# Patient Record
Sex: Male | Born: 1960
Health system: Southern US, Community
[De-identification: ages and names within clinical notes are randomized; demographics above are authoritative.]

## PROBLEM LIST (undated history)

## (undated) DIAGNOSIS — I10 Essential (primary) hypertension: Secondary | ICD-10-CM

## (undated) DIAGNOSIS — A159 Respiratory tuberculosis unspecified: Secondary | ICD-10-CM

## (undated) DIAGNOSIS — F1910 Other psychoactive substance abuse, uncomplicated: Secondary | ICD-10-CM

## (undated) DIAGNOSIS — J449 Chronic obstructive pulmonary disease, unspecified: Secondary | ICD-10-CM

## (undated) DIAGNOSIS — F419 Anxiety disorder, unspecified: Secondary | ICD-10-CM

## (undated) DIAGNOSIS — F1010 Alcohol abuse, uncomplicated: Secondary | ICD-10-CM

## (undated) DIAGNOSIS — J189 Pneumonia, unspecified organism: Secondary | ICD-10-CM

## (undated) HISTORY — PX: ANTERIOR CERVICAL DECOMP/DISCECTOMY FUSION: SHX1161

## (undated) HISTORY — PX: BACK SURGERY: SHX140

## (undated) HISTORY — PX: SHOULDER ARTHROSCOPY WITH ROTATOR CUFF REPAIR: SHX5685

## (undated) HISTORY — PX: ORBITAL FRACTURE SURGERY: SHX725

## (undated) HISTORY — DX: Essential (primary) hypertension: I10

## (undated) HISTORY — PX: INGUINAL HERNIA REPAIR: SUR1180

## (undated) HISTORY — PX: APPENDECTOMY: SHX54

## (undated) HISTORY — PX: ULNAR NERVE REPAIR: SHX2594

---

## 1998-11-15 ENCOUNTER — Ambulatory Visit (HOSPITAL_COMMUNITY): Admission: RE | Admit: 1998-11-15 | Discharge: 1998-11-15 | Payer: Self-pay | Admitting: Orthopedic Surgery

## 1998-11-15 ENCOUNTER — Encounter: Payer: Self-pay | Admitting: Orthopedic Surgery

## 2001-01-24 ENCOUNTER — Emergency Department (HOSPITAL_COMMUNITY): Admission: EM | Admit: 2001-01-24 | Discharge: 2001-01-24 | Payer: Self-pay | Admitting: Emergency Medicine

## 2004-05-27 ENCOUNTER — Encounter: Admission: RE | Admit: 2004-05-27 | Discharge: 2004-06-06 | Payer: Self-pay | Admitting: Nurse Practitioner

## 2005-11-20 ENCOUNTER — Emergency Department (HOSPITAL_COMMUNITY): Admission: EM | Admit: 2005-11-20 | Discharge: 2005-11-20 | Payer: Self-pay | Admitting: Emergency Medicine

## 2005-11-21 ENCOUNTER — Inpatient Hospital Stay (HOSPITAL_COMMUNITY): Admission: EM | Admit: 2005-11-21 | Discharge: 2005-11-26 | Payer: Self-pay | Admitting: Emergency Medicine

## 2010-05-21 ENCOUNTER — Emergency Department (HOSPITAL_COMMUNITY): Admission: EM | Admit: 2010-05-21 | Discharge: 2010-05-21 | Payer: Self-pay | Admitting: Emergency Medicine

## 2010-06-20 ENCOUNTER — Emergency Department (HOSPITAL_COMMUNITY): Admission: EM | Admit: 2010-06-20 | Discharge: 2010-06-20 | Payer: Self-pay | Admitting: Emergency Medicine

## 2010-08-19 ENCOUNTER — Emergency Department (HOSPITAL_COMMUNITY): Admission: EM | Admit: 2010-08-19 | Discharge: 2010-08-19 | Payer: Self-pay | Admitting: Emergency Medicine

## 2011-02-19 ENCOUNTER — Emergency Department (HOSPITAL_COMMUNITY)
Admission: EM | Admit: 2011-02-19 | Discharge: 2011-02-19 | Disposition: A | Discharge status: Home / Self Care | Payer: Self-pay | Attending: Emergency Medicine | Admitting: Emergency Medicine

## 2011-02-19 DIAGNOSIS — X58XXXA Exposure to other specified factors, initial encounter: Secondary | ICD-10-CM | POA: Insufficient documentation

## 2011-02-19 DIAGNOSIS — S335XXA Sprain of ligaments of lumbar spine, initial encounter: Secondary | ICD-10-CM | POA: Insufficient documentation

## 2011-02-19 DIAGNOSIS — M5430 Sciatica, unspecified side: Secondary | ICD-10-CM | POA: Insufficient documentation

## 2011-04-15 ENCOUNTER — Emergency Department (HOSPITAL_COMMUNITY): Payer: Worker's Compensation

## 2011-04-15 ENCOUNTER — Emergency Department (HOSPITAL_COMMUNITY)
Admission: EM | Admit: 2011-04-15 | Discharge: 2011-04-16 | Disposition: A | Discharge status: Home / Self Care | Payer: Worker's Compensation | Attending: Emergency Medicine | Admitting: Emergency Medicine

## 2011-04-15 DIAGNOSIS — Z23 Encounter for immunization: Secondary | ICD-10-CM | POA: Insufficient documentation

## 2011-04-15 DIAGNOSIS — M79609 Pain in unspecified limb: Secondary | ICD-10-CM | POA: Insufficient documentation

## 2011-04-15 DIAGNOSIS — L03019 Cellulitis of unspecified finger: Secondary | ICD-10-CM | POA: Insufficient documentation

## 2011-04-15 DIAGNOSIS — L02519 Cutaneous abscess of unspecified hand: Secondary | ICD-10-CM | POA: Insufficient documentation

## 2011-04-16 LAB — BASIC METABOLIC PANEL
BUN: 11 mg/dL (ref 6–23)
Chloride: 100 mEq/L (ref 96–112)
Glucose, Bld: 99 mg/dL (ref 70–99)
Potassium: 3.8 mEq/L (ref 3.5–5.1)

## 2011-04-16 LAB — GRAM STAIN

## 2011-04-16 LAB — DIFFERENTIAL
Basophils Absolute: 0.1 10*3/uL (ref 0.0–0.1)
Basophils Relative: 1 % (ref 0–1)
Eosinophils Absolute: 0.4 10*3/uL (ref 0.0–0.7)
Monocytes Absolute: 1.1 10*3/uL — ABNORMAL HIGH (ref 0.1–1.0)
Neutro Abs: 9 10*3/uL — ABNORMAL HIGH (ref 1.7–7.7)

## 2011-04-16 LAB — CBC
Hemoglobin: 15.1 g/dL (ref 13.0–17.0)
MCHC: 34.1 g/dL (ref 30.0–36.0)
RDW: 13.5 % (ref 11.5–15.5)

## 2011-04-17 NOTE — Discharge Summary (Signed)
Alejandro Ferguson, [name]                  ACCOUNT NO.:  [id number]   MEDICAL RECORD NO.:  [id number]          PATIENT TYPE:  INP   LOCATION:  1612                         FACILITY:  [facility]   PHYSICIAN:  Isidor Holts, M.D.  DATE OF BIRTH:  1961/10/06   DATE OF ADMISSION:  11/21/2005  DATE OF DISCHARGE:  11/25/2005                                 DISCHARGE SUMMARY   DISCHARGE DIAGNOSES:  1.  Bilateral community acquired pneumonia.  2.  Acute maxillary sinusitis.  3.  Chronic obstructive pulmonary disease.  4.  Substance abuse.   DISCHARGE MEDICATIONS:  1.  Avelox 400 mg p.o. daily x5 days from November 21, 2005.  2.  Motrin 800 mg p.o. t.i.d. with meals for 1 week.  3.  Ultram 50 mg p.o. p.r.n. q.6 hourly for headache.  A total of 12 pills      have been dispensed.   PROCEDURE:  1.  Chest x-ray dated November 20, 2005. This showed changes of COPD and      bronchitis with pneumonia in the medial right lower lobe.  2.  Chest x-ray dated November 21, 2005. This showed emphysema and left      lower lobe pneumonia.  3.  Head CT scan dated November 21, 2005. This showed no intracranial      abnormality, maxillary sinus inflammatory changes or physical changes,      anterior wall of frontal sinuses on the right, no sinus disease in that      region presently.   CONSULTATIONS:  None.   HISTORY:  As in H&P note of November 21, 2005. However, in brief, this is a  50 year old male, with a background history of multiple surgeries, including  disk operation on his neck, shoulder surgery and a plate in his skull, as  well as appendectomy, who presents with a week`s history of cough, fever,  and production of yellow phlegm. He was seen on November 20, 2005, in the  emergency department, started on antibiotics and analgesics but due to  persistence of symptoms including hemoptysis and headache. He re-presented  on November 21, 2005 and was admitted for further evaluation, investigation  and  management.   CLINICAL COURSE:  1.  Community acquired pneumonia.  This was managed with a combination of      azithromycin and Rocephin. The patient completed a 5 day course of this      treatment on November 25, 2005 and showed steady improvement clinically.      The cough became markedly diminished in frequency and phlegm became      clear. He was apyrexial throughout the course of his hospital stay and      showed no evidence of shortness of breath. On physical exam on November 25, 2005, lung fields were clear. It appears that his pneumonia has      either markedly improved or resolved clinically. He was considered      sufficiently recovered for discharge home on November 25, 2005 on a 4-5      day course of Avelox.  1.  Chronic obstructive pulmonary disease. This was evident on chest x-ray      dated November 21, 2005, however, the patient showed no shortness of      breath or wheeze referable to this. It is deemed that this has remained      stable.   1.  Acute maxillary sinusitis. An incidental finding on head CT scan, dated      November 21, 2005. The patient, however, does not complain of nasal      congestion or stuffiness.  aA a matter of fact, initial nasal      decongestant which was started at the time of admission, was      discontinued on November 24, 2005, without any relapse of symptoms. He      does have a complaint of a persistent headache, which is likely      secondary to his sinusitis. He is being managed with NSAIDs for this,      and will be discharged home on additional p.r.n. Ultram for      breakthrough. It is expected that this will resolve as sinus infection      and pneumonia resolve clinically. This has been communicated to the      patient accordingly.   1.  History of substance abuse. The patient has been counseled accordingly.      He, however, insists, that he does not abuse substances and that he does      not require counseling. I do not  feel that he will attend ADS as      recommended.   DISPOSITION:  The patient was discharged in satisfactory condition on  November 25, 2005. He is expected to return to work on December 01, 2005.   DIET:  No restrictions.   ACTIVITY:  As tolerated.   WOUND CARE:  N/A.   PAIN MANAGEMENT:  N/A.   FOLLOW UP:  The patient is to followup with his PMD, he is to call for an  appointment. If he does not have a PMD, he has been recommended to find one  and establish care, for routine preventative purposes. All of this has been  communicated to the patient, he has verbalized understanding.      Isidor Holts, M.D.  Electronically Signed     CO/MEDQ  D:  11/25/2005  T:  11/25/2005  Job:  [id number]

## 2011-04-17 NOTE — H&P (Signed)
NAMECRISS, PALLONE                  ACCOUNT NO.:  [id number]   MEDICAL RECORD NO.:  [id number]          PATIENT TYPE:  INP   LOCATION:  1612                         FACILITY:  [facility]   PHYSICIAN:  Jonna L. Robb Matar, M.D.DATE OF BIRTH:  May 10, 1961   DATE OF ADMISSION:  11/21/2005  DATE OF DISCHARGE:                                HISTORY & PHYSICAL   CHIEF COMPLAINT:  Coughing up blood.   HISTORY:  This 50 year old white male has had a week's history of cough,  fever, production of yellow mucus. Yesterday he was in the emergency room,  was diagnosed with pneumonia, but did not want to come in. He received one  dose each of Rocephin and Zithromax and was sent home on Levaquin, but today  he started to have some hemoptysis and was still having a lot of headache  pain, so he was willing to come in and be admitted today.   PAST MEDICAL HISTORY:  Mostly notable for multiple surgeries including disc  operation on his neck, shoulder surgery, he has a plate in his head, an  appendectomy.   FAMILY HISTORY:  Hypertension, lung cancer.   SOCIAL HISTORY:  He smokes. Says he only does drugs occasionally. Separated,  three children.   ALLERGIES:  He said when he was 50 years old that PENICILLIN gave him  problems with his throat.   MEDICINES:  He was sent home on Levaquin and Vicodin.   REVIEW OF SYSTEMS:  Other than the immediate illness, all other systems were  reviewed and were negative.   PHYSICAL EXAMINATION:  VITAL SIGNS:  Temperature 97.3, pulse 93,  respirations 22, blood pressure 125/85.  GENERAL:  He is an anxious, jittery Caucasian male sitting up in bed,  occasional coughing.  HEENT:  Normal hearing, erythematous pharynx.  NECK:  Shows no masses, thyromegaly, or carotid bruits.  RESPIRATORY:  Effort is normal. The lungs have some scattered rhonchi,  especially at the bases, without wheezing or rales. There is no dullness.  HEART:  Has a regular rate and rhythm, normal S1 and  S2, without murmurs,  rubs, or gallops. Pulses without bruits. There is no clubbing, cyanosis, or  edema.  ABDOMEN:  Nontender, normal bowel sounds. No hepatosplenomegaly or hernia.  GENITOURINARY:  External genitalia is normal.  MUSCULOSKELETAL:  There is no cervical adenopathy. Muscle strength is 5/5  with full range of motion all four extremities.  SKIN:  Shows no rashes, lesions, nodules.  NEUROLOGIC:  Cranial nerves are intact. There is some tenderness over the  sinuses. DTRs are 2+. Toes go down. The patient is alert and oriented x3,  normal memory, anxious affect. He seems to be excessively concerned about  getting pain medicine for his headache. Also has not eaten, so he is rather  jittery.   LABORATORY WORK:  CT of the head shows a normal brain but does have  maxillary sinusitis. Urinalysis negative. Drugs of abuse were positive for  opiates, cocaine, and tetrahydrocannabinol. Chest x-ray on December 22 shows  COPD, bronchitis, with some right lower lobe pneumonia. White count was  18.1.  Today the white count is down to 11.1. Chest x-ray shows left lower  lobe infiltrate is worse. BUN and creatinine are 10 and 0.7, glucose was 63.   IMPRESSION:  1.  Right lower lobe pneumonia. Will put him back on Rocephin and Zithromax.  2.  Maxillary sinusitis. I am going to put him on some decongestants,      antihistamines, nasal spray, try to open up the sinuses.  3.  Polysubstance abuse. I would really prefer not to give this gentleman      large amounts of narcotics so will try p.o. Percocet if IV Toradol does      not work. I am also going to give him some Xanax to calm down as he is      very jittery and I wonder if this is from the cocaine withdrawal.      Jonna L. Robb Matar, M.D.  Electronically Signed     JLB/MEDQ  D:  11/21/2005  T:  11/22/2005  Job:  [id number]

## 2011-04-18 LAB — WOUND CULTURE

## 2011-04-24 ENCOUNTER — Emergency Department (HOSPITAL_COMMUNITY)
Admission: EM | Admit: 2011-04-24 | Discharge: 2011-04-24 | Disposition: A | Discharge status: Home / Self Care | Payer: Worker's Compensation | Attending: Emergency Medicine | Admitting: Emergency Medicine

## 2011-04-24 DIAGNOSIS — L03019 Cellulitis of unspecified finger: Secondary | ICD-10-CM | POA: Insufficient documentation

## 2011-04-24 DIAGNOSIS — M7989 Other specified soft tissue disorders: Secondary | ICD-10-CM | POA: Insufficient documentation

## 2011-04-24 DIAGNOSIS — Z9889 Other specified postprocedural states: Secondary | ICD-10-CM | POA: Insufficient documentation

## 2011-04-24 DIAGNOSIS — L02519 Cutaneous abscess of unspecified hand: Secondary | ICD-10-CM | POA: Insufficient documentation

## 2011-05-12 ENCOUNTER — Ambulatory Visit (HOSPITAL_COMMUNITY)
Admission: RE | Admit: 2011-05-12 | Discharge: 2011-05-12 | Disposition: A | Discharge status: Home / Self Care | Payer: Worker's Compensation | Source: Ambulatory Visit | Attending: Plastic Surgery | Admitting: Plastic Surgery

## 2011-05-12 ENCOUNTER — Other Ambulatory Visit (HOSPITAL_COMMUNITY): Payer: Self-pay | Admitting: Plastic Surgery

## 2011-05-12 DIAGNOSIS — R609 Edema, unspecified: Secondary | ICD-10-CM | POA: Insufficient documentation

## 2011-05-12 DIAGNOSIS — M79609 Pain in unspecified limb: Secondary | ICD-10-CM | POA: Insufficient documentation

## 2011-05-12 DIAGNOSIS — R52 Pain, unspecified: Secondary | ICD-10-CM

## 2011-06-01 ENCOUNTER — Encounter: Payer: Self-pay | Admitting: Occupational Therapy

## 2011-07-21 ENCOUNTER — Emergency Department (HOSPITAL_COMMUNITY): Payer: Self-pay

## 2011-07-21 ENCOUNTER — Encounter (HOSPITAL_COMMUNITY): Payer: Self-pay | Admitting: Radiology

## 2011-07-21 ENCOUNTER — Inpatient Hospital Stay (HOSPITAL_COMMUNITY)
Admission: EM | Admit: 2011-07-21 | Discharge: 2011-07-23 | DRG: 192 | Disposition: A | Discharge status: Home / Self Care | Payer: Self-pay | Attending: Internal Medicine | Admitting: Internal Medicine

## 2011-07-21 DIAGNOSIS — Y92009 Unspecified place in unspecified non-institutional (private) residence as the place of occurrence of the external cause: Secondary | ICD-10-CM

## 2011-07-21 DIAGNOSIS — Z7982 Long term (current) use of aspirin: Secondary | ICD-10-CM

## 2011-07-21 DIAGNOSIS — F1020 Alcohol dependence, uncomplicated: Secondary | ICD-10-CM | POA: Diagnosis present

## 2011-07-21 DIAGNOSIS — Z79899 Other long term (current) drug therapy: Secondary | ICD-10-CM

## 2011-07-21 DIAGNOSIS — J441 Chronic obstructive pulmonary disease with (acute) exacerbation: Principal | ICD-10-CM | POA: Diagnosis present

## 2011-07-21 DIAGNOSIS — D72829 Elevated white blood cell count, unspecified: Secondary | ICD-10-CM | POA: Diagnosis present

## 2011-07-21 DIAGNOSIS — T486X5A Adverse effect of antiasthmatics, initial encounter: Secondary | ICD-10-CM | POA: Diagnosis present

## 2011-07-21 DIAGNOSIS — R071 Chest pain on breathing: Secondary | ICD-10-CM | POA: Diagnosis present

## 2011-07-21 DIAGNOSIS — Z801 Family history of malignant neoplasm of trachea, bronchus and lung: Secondary | ICD-10-CM

## 2011-07-21 DIAGNOSIS — R000 Tachycardia, unspecified: Secondary | ICD-10-CM | POA: Diagnosis present

## 2011-07-21 DIAGNOSIS — IMO0002 Reserved for concepts with insufficient information to code with codable children: Secondary | ICD-10-CM

## 2011-07-21 DIAGNOSIS — R222 Localized swelling, mass and lump, trunk: Secondary | ICD-10-CM | POA: Diagnosis present

## 2011-07-21 DIAGNOSIS — Z880 Allergy status to penicillin: Secondary | ICD-10-CM

## 2011-07-21 DIAGNOSIS — Z8249 Family history of ischemic heart disease and other diseases of the circulatory system: Secondary | ICD-10-CM

## 2011-07-21 DIAGNOSIS — Z87891 Personal history of nicotine dependence: Secondary | ICD-10-CM

## 2011-07-21 HISTORY — DX: Alcohol abuse, uncomplicated: F10.10

## 2011-07-21 LAB — BASIC METABOLIC PANEL
BUN: 7 mg/dL (ref 6–23)
Chloride: 97 mEq/L (ref 96–112)
Creatinine, Ser: 0.96 mg/dL (ref 0.50–1.35)
GFR calc Af Amer: >60 mL/min (ref >60)
GFR calc non Af Amer: >60 mL/min (ref >60)

## 2011-07-21 LAB — CBC
HCT: 47.9 % (ref 39.0–52.0)
MCH: 32.8 pg (ref 26.0–34.0)
MCHC: 35.7 g/dL (ref 30.0–36.0)
MCV: 91.8 fL (ref 78.0–100.0)
RDW: 13.1 % (ref 11.5–15.5)

## 2011-07-21 LAB — PHOSPHORUS: Phosphorus: 2.2 mg/dL — ABNORMAL LOW (ref 2.3–4.6)

## 2011-07-21 LAB — TROPONIN I: Troponin I: <0.3 ng/mL (ref <0.30)

## 2011-07-21 LAB — APTT: aPTT: 32 seconds (ref 24–37)

## 2011-07-21 LAB — CK TOTAL AND CKMB (NOT AT ARMC): Relative Index: INVALID (ref 0.0–2.5)

## 2011-07-21 MED ORDER — IOHEXOL 300 MG/ML  SOLN
100.0000 mL | Freq: Once | INTRAMUSCULAR | Status: AC | PRN
  Administered 2011-07-21: 100 mL via INTRAVENOUS

## 2011-07-22 ENCOUNTER — Telehealth: Payer: Self-pay | Admitting: Pulmonary Disease

## 2011-07-22 DIAGNOSIS — R222 Localized swelling, mass and lump, trunk: Secondary | ICD-10-CM

## 2011-07-22 DIAGNOSIS — J690 Pneumonitis due to inhalation of food and vomit: Secondary | ICD-10-CM

## 2011-07-22 DIAGNOSIS — J13 Pneumonia due to Streptococcus pneumoniae: Secondary | ICD-10-CM

## 2011-07-22 DIAGNOSIS — R911 Solitary pulmonary nodule: Secondary | ICD-10-CM

## 2011-07-22 LAB — CBC
HCT: 46.4 % (ref 39.0–52.0)
MCH: 33.2 pg (ref 26.0–34.0)
MCV: 92.8 fL (ref 78.0–100.0)
Platelets: 193 10*3/uL (ref 150–400)
RBC: 5 MIL/uL (ref 4.22–5.81)

## 2011-07-22 LAB — URINALYSIS, ROUTINE W REFLEX MICROSCOPIC
Bilirubin Urine: NEGATIVE
Glucose, UA: 100 mg/dL — AB
Hgb urine dipstick: NEGATIVE
Ketones, ur: NEGATIVE mg/dL
Protein, ur: NEGATIVE mg/dL
Urobilinogen, UA: 0.2 mg/dL (ref 0.0–1.0)

## 2011-07-22 LAB — AMYLASE: Amylase: 78 U/L (ref 0–105)

## 2011-07-22 LAB — COMPREHENSIVE METABOLIC PANEL
ALT: 19 U/L (ref 0–53)
AST: 24 U/L (ref 0–37)
Albumin: 3.4 g/dL — ABNORMAL LOW (ref 3.5–5.2)
Alkaline Phosphatase: 86 U/L (ref 39–117)
CO2: 25 mEq/L (ref 19–32)
Chloride: 101 mEq/L (ref 96–112)
Creatinine, Ser: 0.86 mg/dL (ref 0.50–1.35)
GFR calc non Af Amer: >60 mL/min (ref >60)
Potassium: 4.6 mEq/L (ref 3.5–5.1)
Total Bilirubin: 0.9 mg/dL (ref 0.3–1.2)

## 2011-07-22 LAB — HEPATIC FUNCTION PANEL
ALT: 17 U/L (ref 0–53)
AST: 21 U/L (ref 0–37)
Bilirubin, Direct: 0.2 mg/dL (ref 0.0–0.3)

## 2011-07-22 LAB — TROPONIN I
Troponin I: <0.3 ng/mL (ref <0.30)
Troponin I: <0.3 ng/mL (ref <0.30)

## 2011-07-22 LAB — CK TOTAL AND CKMB (NOT AT ARMC): Relative Index: INVALID (ref 0.0–2.5)

## 2011-07-22 NOTE — Telephone Encounter (Signed)
Pt has post hosp f/u appt with RA on 9/24.  Brandi O states pt needs f/u CT in 3 weeks.  RA, please advise if CT is w/ or w/o contrast and a dx and I can go ahead and put in order for you.  Thanks.

## 2011-07-22 NOTE — Telephone Encounter (Signed)
Ct with contrast - FU RLL nodule - order based on consult by dr Tyson Alias.

## 2011-07-23 NOTE — Discharge Summary (Signed)
NAMECAHLIL, SATTAR NO.:  [id number]  MEDICAL RECORD NO.:  [id number]  LOCATION:  3741                         FACILITY:  [facility]  PHYSICIAN:  Andreas Blower, MD       DATE OF BIRTH:  01/13/1961  DATE OF ADMISSION:  07/21/2011 DATE OF DISCHARGE:  07/23/2011                              DISCHARGE SUMMARY   PRIMARY CARE PHYSICIAN:  The patient is in the process of being scheduled to see [facility].  Pulmonologist is going to be Oretha Milch, MD.  DISCHARGE DIAGNOSES: 1. Chronic obstructive pulmonary disease exacerbation. 2. 3-cm anteromedial right upper lobe mass of unclear etiology. 3. Alcohol dependence. 4. Chest pain likely chest wall pain from cough. 5. Tachycardia. 6. Leukocytosis.  DISCHARGE MEDICATIONS: 1. Advair Diskus 250/50 one puff inhaled twice daily to start after     completing prednisone taper. 2. Benzonatate 100 mg p.o. 3 times a day as needed for cough. 3. Combivent 1 spray every 6 hours as needed for shortness of breath. 4. Folic acid 1 mg p.o. daily. 5. Guaifenesin 600 mg p.o. twice daily. 6. Guaifenesin/DM 100-10 mg/5 mL syrup 5 mL every 4 hours as needed     for cough. 7. Hydrocodone/APAP 5/325 one tablet every 6 hours as needed for     cough, given total of 20 tablets. 8. Moxifloxacin 400 mg p.o. daily for 19 more days to complete a 3-     week course of antibiotics. 9. Prednisone 40 mg p.o. daily for 2 days, then 20 mg p.o. daily for 2     days, then 10 mg p.o. daily for 2 days, then 5 mg p.o. daily for 2     days, and then discontinue. 10.Thiamine 100 mg p.o. daily. 11.Aspirin 81 mg p.o. daily.  BRIEF ADMITTING HISTORY AND PHYSICAL:  Mr. Hughlett is a 50 year old Caucasian male with history of tobacco use, who quit smoking about a month ago, comes in with complaints of hemoptysis, chest pain, and shortness of breath.  RADIOLOGY/IMAGING: 1. The patient had chest x-ray two-view which showed bullous emphysema     without acute  disease. 2. The patient had a CT of the chest with contrast, which was negative     for acute PE.  Showed 3-cm anteromedial right upper lobe mass-like     opacity with adjacent right hilar adenopathy.  A 5-mm subpleural     lingula nodule noted.  LABORATORY DATA:  CBC shows a white count of 11.1, hemoglobin 16.6, hematocrit 46.4, platelet count 193.  Electrolytes normal with a BUN 8, creatinine 0.86.  Liver function tests normal with albumin of 3.4. Troponins negative x3.  TSH is 0.37.  Blood alcohol level was less than 11.  UA is negative for nitrites and leukocytes.  Blood cultures x2 showed no growth to date.  CONSULTATIONS:  Dr. Tyson Alias from Pulmonary evaluated the patient during the course of the hospital stay.  HOSPITAL COURSE BY PROBLEM: 1. Chronic obstructive pulmonary disease exacerbation.  Initially, the     patient was started on IV steroids and IV moxifloxacin.  During the     course of the hospital stay as his breathing was improved,  his     steroids were transitioned to oral taper.  The patient will be on     moxifloxacin for 19 more days to complete at least 3-week course of     antibiotics.  After the patient completes prednisone taper, the     patient will be on Advair for maintenance. 2. 3-cm anteromedial right upper lobe mass.  Pulmonology was     consulted.  They recommended treating the patient for a total of 3     weeks with antibiotics then to follow up in their clinic for     further evaluation.  If this mass is infectious in etiology should     improve as he is treated with antibiotics.  If the patient has     persistent mass, it will need further workup during follow up with     Pulmonology. 3. Alcohol dependence.  The patient was started on CIWA protocol.  On     extensive discussion with the patient, he does not believe that he     has problem with alcohol and also was not interested in being     offered resources for alcohol cessation.  He will be  discharged     home on thiamine and folate. 4. Chest pain.  The patient was admitted and was ruled out for acute     coronary syndrome.  No events were noted on tele.  The patient had     pain to palpation on the chest.  Suspect the chest wall pain is     likely musculoskeletal from cough. 5. Cough with some hemoptysis likely due to COPD exacerbation with the     infectious etiology.  Improved during the course of the hospital     stay. 6. Tobacco use.  The patient reports that he has not smoked for about     a month and promises to stay away from smoking. 7. Tachycardia. Likely from albuterol and cough. Improved during     the course of the hospital stay.  DISPOSITION AND FOLLOWUP:  The patient to follow up with Dr. Vassie Loll on August 24, 2011 at 11:15 a.m.  The patient was instructed to be in office at 11 a.m.  The patient to follow with [facility], Dr. Clelia Croft on September 01, 2011 at 2:45 p.m.  The patient was also instructed that Dr. Reginia Naas office will call the patient for CT of the chest.  Time spent on discharge talking to the patient, going over the instructions, and coordinating care was 35 minutes.     Andreas Blower, MDSR/MEDQ  D:  07/23/2011  T:  07/23/2011  Job:  [id number]  Electronically Signed by Wardell Heath Deshon Koslowski  on 07/23/2011 09:38:55 PM

## 2011-07-24 NOTE — Consult Note (Addendum)
NAMEMARTI, Alejandro Ferguson NO.:  [id number]  MEDICAL RECORD NO.:  [id number]  LOCATION:  3741                         FACILITY:  [facility]  PHYSICIAN:  Nelda Bucks, MD DATE OF BIRTH:  Jun 11, 1961  DATE OF CONSULTATION:  07/22/2011 DATE OF DISCHARGE:                                CONSULTATION   REQUESTING PHYSICIAN:  Dr. Betti Cruz from [facility].  ALLERGIES:  Alejandro Ferguson has an allergy to PENICILLIN.  He indicates that this is a childhood allergy and he is unaware of specific response.  CHIEF COMPLAINT:  Abnormal CT scan of the chest.  HISTORY OF PRESENT ILLNESS:  Alejandro Ferguson is a 50 year old white male former tobacco abuser with a past medical history of cervical disk surgery and facial trauma after a fall from scaffolding, left shoulder surgery who was admitted to [facility] per [facility] on July 21, 2011, with a 2-day history of chest pain, productive cough with flecks of blood and fevers.  He also had episodes of nausea and vomiting on Monday, July 20, 2011, and had hematemesis and vomiting.  Post vomiting episodes, his bloody sputum also resolved.  He presented to the ER with fevers of 102.8 and in the setting of chest pain, shortness of breath, a CTA was obtained which was negative for PE, but did demonstrate severe upper lobe predominant bullous emphysema and right upper lobe mass-like opacity.  Pulmonary and Critical Care Medicine were consulted for evaluation of right upper lobe mass/opacity.  PAST MEDICAL HISTORY: 1. Cervical disk surgery. 2. Left shoulder surgery. 3. Appendectomy. 4. Tobacco abuse. 5. Emphysema diagnosed on CT scan July 21, 2011.  SOCIAL HISTORY:  Alejandro Ferguson is divorced and has 2 children a daughter and stepdaughter.  He lives in [place] alone and he works as an Engineer, petroleum.  He is a former smoker.  He quit in July 2012.  He indicates that he quit due to cough.  He began smoking at age 31 and smoked 1  pack per day initially.  However, at his heaviest and at time of quitting, he smoked 2 packs per day for a total of 32 years.  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  He indicates that he did wear a mask.  However, he wonders how effective they were.  He drinks four 12 ounces of beer per day on a usual day.  However, on the weekends, he does drink a 12-pack per day on the weekend, but he also indicates that he does not have to drink every day and does not ever have withdrawal symptoms.  He indicates occasional marijuana use and regular diet.  FAMILY HISTORY:  His mother is still living in age 64.  She has a history of hypertension.  His father he has never known and does not have a relationship with.  His grandfather has a history of lung cancer and is deceased at age 110.  He has 2 brothers who are relatively healthy.  One currently is at [facility] for a hand surgery.  MEDICATIONS:  He has no home medications.  However, currently he is on Lovenox,  Ventolin and Atrovent q.6, guaifenesin, Protonix, Solu-Medrol, Ativan, CIWA protocol and Avelox starting on July 21, 2011.  REVIEW OF SYSTEMS:  GENERAL:  He indicates fevers, chills and sweats. However, no weight loss.  He actually has gained weight.  HEENT:  He denies headaches, nasal discharge, nose bleeds or vision hearing loss. He does have one of his original teeth remaining and is pending having that removed.  Otherwise, he wears dentures on the upper only.  SKIN: No rashes or lesions.  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  He recently has had chest pain and cough with some wheezing. Please see history of present illness for further details.  GU:  Denies frequency, urgency, dysuria or hematuria.  GI:  He has had nausea and vomiting on the 20th and 21st and he indicates abdominal pain with vomiting  and also had small amounts of blood in the vomit and flex of blood in his sputum.  This has since resolved since vomiting has resolved.  PHYSICAL EXAMINATION:  VITALS:  Temperature 99.5 with a T-max of 102.8, blood pressure 122/85, heart rate 98, respirations 20 and sats 97% on room air. GENERAL:  This is a well-developed, well-nourished adult male, in no acute distress. NEURO:  He is awake, alert and oriented.  Cranial nerves II through XII are grossly intact.  Strength is 5/5 in all extremities.  Speech is clear. HEENT:  Normocephalic, atraumatic.  Pupils are equal, round and reactive to light.  EOMs are intact.  Sclerae are clear.  Nares are patent without discharge.  Mucous membranes are moist.  He is for the most part edentulous.  He does have one small tooth on the lower level that does not have any evidence of active infection.  The gums are pink without drainage or redness. NECK:  Supple without lymphadenopathy, no nodes palpated. CV:  S1-S2, regular rate and rhythm.  No murmurs, rubs or gallops. Pulses are +2 and equal bilaterally. PULMONARY:  Respirations are even and unlabored on room air. LUNGS:  Bilaterally are distant, but clear. GI:  Abdomen is soft, nontender.  Bowel sounds x4 active. MUSCULOSKELETAL:  He is moving all extremities.  No obvious joint deformity or lesions. SKIN:  No rashes or lesions identified.  RADIOLOGY:  CT of the angio of the chest on July 21, 2011, demonstrates upper lobe predominant bullous emphysema with a right upper lobe 3-cm opacity.  LABORATORY DATA:  Labs for July 22, 2011 include; sodium 136, potassium 4.6, chloride 101, CO2 25, BUN 8, creatinine 0.86, glucose 167, hemoglobin 16.6, hematocrit 46.4, WBC 11.1 and platelet count 193. Alkaline phos is 86, AST 24, ALT 19, CK 90, CK-MB 1.8 and troponin less than 0.30.  MICRO DATA:  Urinalysis on July 22, 2011, is negative.  ASSESSMENT AND PLAN:  Right upper lobe opacity.   Differential diagnosis includes mass versus aspiration with recent vomiting episodes.  He does have a known history of heavy alcohol and tobacco abuse as well as asbestos exposure in his previous occupation.  It is recommended at this time that the CT scan is not necessarily clear-cut per aspiration. However, he did have significant risk with fever and leaning toward infectious etiology, however, cancer must be pulled out with his previous risk factors for malignancy.  No significant hemoptysis or weight loss noted.  If there were to be an increase in hemoptysis or change in symptomology, it was recommended that  he likely have a navigation bronchoscopy with biopsy.  However, at this time, it is recommended he continue with 3 weeks of antibiotics and a repeat CT scan at 3 weeks with a follow up office appointment to review CT scan.     Alejandro Brim, NP   ______________________________ Nelda Bucks, MD    BO/MEDQ  D:  07/22/2011  T:  07/22/2011  Job:  [id number]  Electronically Signed by Alejandro Ferguson  on 07/24/2011 03:53:34 PM Electronically Signed by Rory Percy MD on 07/30/2011 03:48:40 PM

## 2011-07-28 LAB — CULTURE, BLOOD (ROUTINE X 2)
Culture  Setup Time: 201208220232
Culture: NO GROWTH
Culture: NO GROWTH

## 2011-08-14 ENCOUNTER — Ambulatory Visit (INDEPENDENT_AMBULATORY_CARE_PROVIDER_SITE_OTHER)
Admission: RE | Admit: 2011-08-14 | Discharge: 2011-08-14 | Disposition: A | Discharge status: Home / Self Care | Payer: Self-pay | Source: Ambulatory Visit | Attending: Pulmonary Disease | Admitting: Pulmonary Disease

## 2011-08-14 DIAGNOSIS — J984 Other disorders of lung: Secondary | ICD-10-CM

## 2011-08-14 DIAGNOSIS — R911 Solitary pulmonary nodule: Secondary | ICD-10-CM

## 2011-08-14 MED ORDER — IOHEXOL 300 MG/ML  SOLN
80.0000 mL | Freq: Once | INTRAMUSCULAR | Status: AC | PRN
  Administered 2011-08-14: 80 mL via INTRAVENOUS

## 2011-08-17 NOTE — H&P (Signed)
NAMESTEPHENSON, CICHY NO.:  [id number]  MEDICAL RECORD NO.:  [id number]  LOCATION:  [facility]                         FACILITY:  [facility]  PHYSICIAN:  Richarda Overlie, MD       DATE OF BIRTH:  [birth year]  DATE OF ADMISSION:  07/21/2011 DATE OF DISCHARGE:                             HISTORY & PHYSICAL   PRIMARY CARE PHYSICIAN:  Unassigned.  CHIEF COMPLAINT:  Hemoptysis.  SUBJECTIVE:  This is a 50 year old male who presents to the ER with a chief complaint of chest pain and shortness of breath associated with a productive cough with flecks of blood in his phlegm for the last 2 days. The patient has also had a fever, low grade at home and was found to have a fever of 102.8 in the ER.  The patient complains of nasal congestion and spasms of cough.  He has also had some pleuritic chest pain, was aggravated by coughing with radiation of the chest pain into his back.  He also states that he has had some nausea associated with his cough.  He also drinks heavily and drinks about 6 beers a day on a daily basis and has not been sober for a very long time.  He denies any urinary urgency, frequency, or dysuria.  He denies any orthopnea, paroxysmal nocturnal dyspnea, dependent edema.  He denies any recent history of travel or sick contacts.  REVIEW OF SYSTEMS:  A 10-point review of systems was done as documented in the HPI.  PAST MEDICAL HISTORY:  History of nicotine dependence.  PAST SURGICAL HISTORY: 1. He has had cervical disk surgery. 2. He has had left shoulder surgery. 3. He has had an appendectomy.  FAMILY HISTORY:  Positive for hypertension, lung cancer.  SOCIAL HISTORY:  He used to smoke a pack and half a day and quit smoking a month ago.  He continues to drink alcohol on a daily basis and admits to drinking 6 beers a day.  He is currently divorced and has 3 children.  ALLERGIES:  HE IS ALLERGIC TO PENICILLIN.  HOME MEDICATIONS:  Currently none.  PHYSICAL  EXAMINATION:  VITAL SIGNS:  Blood pressure 111/77, pulse of 100, respirations 20, temperature 100.6 and currently 102.8. GENERAL:  Comfortable, currently in no acute cardiopulmonary distress. HEENT:  Pupils equal and reactive.  Extraocular movements intact. NECK:  Supple.  No JVD. LUNGS:  Bibasilar wheezing and crackles at the bases. CARDIOVASCULAR:  Regular rate and rhythm.  No murmurs, rubs, or gallops. ABDOMEN:  Soft, nontender, nondistended. EXTREMITIES:  Without cyanosis, clubbing, or edema. NEUROLOGIC:  Cranial nerves II through XII grossly intact. PSYCHIATRIC:  Appropriate mood and affect.  LABORATORY DATA:  BMET:  Sodium 133, potassium 3.9, chloride 97, bicarb 27, glucose 104, BUN 7, creatinine 0.96, calcium 9.0.  CBC:  WBC 11.8, hemoglobin 17.1, hematocrit 47.9, and platelet count of 205.  Chest x-ray shows bullous emphysema without any acute disease.  ASSESSMENT: 1. Chronic obstructive pulmonary disease exacerbation, likely     secondary to acute bronchitis/pneumonia.  We also need to rule out     a pulmonary embolism.  The patient also has had some pleuritic  chest pain; therefore, pulmonary embolism needs to be ruled out. 2. Ethyl alcohol dependence.  Monitor for withdrawal.  PLAN:  The patient will be admitted to telemetry.  We will cycle his cardiac enzymes and he will stay on telemetry, especially because of history of alcohol abuse.  Given the fact that he is at high risk for delirium tremens, we will treat his COPD exacerbation with low-dose Solu- Medrol nebulizer treatments and also start him on Avelox empirically. We will check a PT/INR because of his history of hemoptysis.  We will also hydrate him since he is quite tachycardic, but his rhythm is normal sinus.  He is a full code.     Richarda Overlie, MD     NA/MEDQ  D:  07/21/2011  T:  07/21/2011  Job:  [id number]  Electronically Signed by Richarda Overlie MD on 08/17/2011 12:00:40 PM

## 2011-08-21 ENCOUNTER — Encounter: Payer: Self-pay | Admitting: Pulmonary Disease

## 2011-08-24 ENCOUNTER — Ambulatory Visit (INDEPENDENT_AMBULATORY_CARE_PROVIDER_SITE_OTHER): Payer: Self-pay | Admitting: Pulmonary Disease

## 2011-08-24 ENCOUNTER — Encounter: Payer: Self-pay | Admitting: Pulmonary Disease

## 2011-08-24 ENCOUNTER — Ambulatory Visit (INDEPENDENT_AMBULATORY_CARE_PROVIDER_SITE_OTHER)
Admission: RE | Admit: 2011-08-24 | Discharge: 2011-08-24 | Disposition: A | Discharge status: Home / Self Care | Payer: Self-pay | Source: Ambulatory Visit | Attending: Pulmonary Disease | Admitting: Pulmonary Disease

## 2011-08-24 VITALS — BP 102/76 | HR 115 | Temp 98.8°F | Ht 72.0 in | Wt 177.2 lb

## 2011-08-24 DIAGNOSIS — F17200 Nicotine dependence, unspecified, uncomplicated: Secondary | ICD-10-CM

## 2011-08-24 DIAGNOSIS — M25551 Pain in right hip: Secondary | ICD-10-CM

## 2011-08-24 DIAGNOSIS — M25559 Pain in unspecified hip: Secondary | ICD-10-CM

## 2011-08-24 DIAGNOSIS — J189 Pneumonia, unspecified organism: Secondary | ICD-10-CM

## 2011-08-24 DIAGNOSIS — Z720 Tobacco use: Secondary | ICD-10-CM

## 2011-08-24 DIAGNOSIS — Z23 Encounter for immunization: Secondary | ICD-10-CM

## 2011-08-24 DIAGNOSIS — J449 Chronic obstructive pulmonary disease, unspecified: Secondary | ICD-10-CM

## 2011-08-24 MED ORDER — HYDROCODONE-ACETAMINOPHEN 5-325 MG PO TABS: 1.0000 | ORAL_TABLET | Freq: Three times a day (TID) | ORAL | Status: DC | PRN

## 2011-08-24 NOTE — Patient Instructions (Signed)
Keep your appt with [facility] -Dr Clelia Croft on OCtober 2nd XRay rt hip today Breathing test showed effect of smoking- emphysema in your lungs Flu shot Pneumonia has resolved - your scan does show remnant scarring in t he top right lung

## 2011-08-24 NOTE — Progress Notes (Signed)
  Subjective:    Patient ID: Alejandro Ferguson, male    DOB: 1961/08/08, 50 y.o.   MRN: [id number]  HPI PCP - Lestine Box  50/M with history of tobacco use, who quit smoking about a month ago, comes in with complaints of hemoptysis, chest pain, and shortness of breath.  CT of the chest with contrast was negative  for acute PE.  Showed 3-cm anteromedial right upper lobe mass-like opacity with adjacent right hilar adenopathy.  A 5-mm subpleural lingular nodule noted.  Treated for a total of 3 weeks with antibiotics  - claims compliance FU CT shows resolution of his mass, rt apical scarring present Has not smoked since dc. C/o rt groin pain x 5 days, no clear trigger, difficulty walking    Review of Systems Pt denies any significant  nasal congestion or excess secretions, fever, chills, sweats, unintended wt loss, pleuritic or exertional cp, orthopnea pnd or leg swelling.  Pt also denies any obvious fluctuation in symptoms with weather or environmental change or other alleviating or aggravating factors.    Pt denies any increase in rescue therapy over baseline, denies waking up needing it or having early am exacerbations or coughing/wheezing/ or dyspnea      Objective:   Physical Exam  Gen. Pleasant, thin, in no distress, normal affect ENT - no lesions, no post nasal drip, class 2 airway Neck: No JVD, no thyromegaly, no carotid bruits Lungs: no use of accessory muscles, no dullness to percussion, decreased without rales or rhonchi  Cardiovascular: Rhythm regular, heart sounds  normal, no murmurs or gallops, no peripheral edema Abdomen: soft and non-tender, no hepatosplenomegaly, BS normal. Musculoskeletal: No deformities, no cyanosis or clubbing, SLR neg, good range of motion rt hip Neuro:  alert, non focal, no tremors       Assessment & Plan:

## 2011-08-25 DIAGNOSIS — J189 Pneumonia, unspecified organism: Secondary | ICD-10-CM | POA: Insufficient documentation

## 2011-08-25 DIAGNOSIS — M25551 Pain in right hip: Secondary | ICD-10-CM | POA: Insufficient documentation

## 2011-08-25 DIAGNOSIS — Z720 Tobacco use: Secondary | ICD-10-CM | POA: Insufficient documentation

## 2011-08-25 NOTE — Progress Notes (Signed)
Quick Note:  I informed pt of RA's findings and recommendations. Pt verbalized understanding  ______ 

## 2011-08-25 NOTE — Assessment & Plan Note (Signed)
XR - sclerotic lesion, significance unclear Given percocet - he will FU with PMD

## 2011-08-25 NOTE — Assessment & Plan Note (Signed)
Counselled Nicotine patches - high risk for relapse

## 2011-08-25 NOTE — Assessment & Plan Note (Signed)
Resolution on CT with ABx consistent with pneumonia. No further ABx required

## 2011-10-05 ENCOUNTER — Encounter (HOSPITAL_COMMUNITY): Payer: Self-pay | Admitting: *Deleted

## 2011-10-05 ENCOUNTER — Other Ambulatory Visit: Payer: Self-pay

## 2011-10-05 ENCOUNTER — Emergency Department (HOSPITAL_COMMUNITY)
Admission: EM | Admit: 2011-10-05 | Discharge: 2011-10-06 | Disposition: A | Discharge status: Home / Self Care | Payer: Self-pay | Attending: Emergency Medicine | Admitting: Emergency Medicine

## 2011-10-05 ENCOUNTER — Emergency Department (HOSPITAL_COMMUNITY): Payer: Self-pay

## 2011-10-05 DIAGNOSIS — R0789 Other chest pain: Secondary | ICD-10-CM

## 2011-10-05 DIAGNOSIS — M549 Dorsalgia, unspecified: Secondary | ICD-10-CM | POA: Insufficient documentation

## 2011-10-05 DIAGNOSIS — J209 Acute bronchitis, unspecified: Secondary | ICD-10-CM

## 2011-10-05 DIAGNOSIS — J40 Bronchitis, not specified as acute or chronic: Secondary | ICD-10-CM | POA: Insufficient documentation

## 2011-10-05 DIAGNOSIS — J441 Chronic obstructive pulmonary disease with (acute) exacerbation: Secondary | ICD-10-CM | POA: Insufficient documentation

## 2011-10-05 DIAGNOSIS — I10 Essential (primary) hypertension: Secondary | ICD-10-CM | POA: Insufficient documentation

## 2011-10-05 DIAGNOSIS — M79609 Pain in unspecified limb: Secondary | ICD-10-CM | POA: Insufficient documentation

## 2011-10-05 DIAGNOSIS — R079 Chest pain, unspecified: Secondary | ICD-10-CM | POA: Insufficient documentation

## 2011-10-05 LAB — RAPID URINE DRUG SCREEN, HOSP PERFORMED
Amphetamines: NOT DETECTED
Barbiturates: NOT DETECTED
Benzodiazepines: NOT DETECTED
Cocaine: POSITIVE — AB
Opiates: POSITIVE — AB
Tetrahydrocannabinol: NOT DETECTED

## 2011-10-05 LAB — URINALYSIS, ROUTINE W REFLEX MICROSCOPIC
Bilirubin Urine: NEGATIVE
Nitrite: NEGATIVE
Specific Gravity, Urine: 1.009 (ref 1.005–1.030)
Urobilinogen, UA: 0.2 mg/dL (ref 0.0–1.0)
pH: 8.5 — ABNORMAL HIGH (ref 5.0–8.0)

## 2011-10-05 LAB — DIFFERENTIAL
Basophils Absolute: 0.1 10*3/uL (ref 0.0–0.1)
Basophils Relative: 1 % (ref 0–1)
Eosinophils Absolute: 0.2 10*3/uL (ref 0.0–0.7)
Monocytes Absolute: 0.9 10*3/uL (ref 0.1–1.0)
Monocytes Relative: 8 % (ref 3–12)
Neutrophils Relative %: 60 % (ref 43–77)

## 2011-10-05 LAB — CBC
HCT: 46.6 % (ref 39.0–52.0)
Hemoglobin: 15.8 g/dL (ref 13.0–17.0)
MCH: 31.9 pg (ref 26.0–34.0)
MCHC: 33.9 g/dL (ref 30.0–36.0)
RDW: 13.3 % (ref 11.5–15.5)

## 2011-10-05 LAB — BASIC METABOLIC PANEL
BUN: 13 mg/dL (ref 6–23)
Creatinine, Ser: 1.01 mg/dL (ref 0.50–1.35)
GFR calc Af Amer: >90 mL/min (ref >90)
GFR calc non Af Amer: 84 mL/min — ABNORMAL LOW (ref >90)
Glucose, Bld: 179 mg/dL — ABNORMAL HIGH (ref 70–99)
Potassium: 3.7 mEq/L (ref 3.5–5.1)

## 2011-10-05 MED ORDER — KETOROLAC TROMETHAMINE 30 MG/ML IJ SOLN
30.0000 mg | Freq: Once | INTRAMUSCULAR | Status: AC
  Administered 2011-10-05: 30 mg via INTRAVENOUS
  Filled 2011-10-05: qty 1

## 2011-10-05 MED ORDER — OXYCODONE-ACETAMINOPHEN 5-325 MG PO TABS
2.0000 | ORAL_TABLET | Freq: Once | ORAL | Status: AC
  Administered 2011-10-05: 2 via ORAL
  Filled 2011-10-05: qty 2

## 2011-10-05 MED ORDER — ASPIRIN 81 MG PO TABS: 81.0000 mg | ORAL_TABLET | Freq: Once | ORAL | Status: DC

## 2011-10-05 MED ORDER — ALBUTEROL SULFATE (5 MG/ML) 0.5% IN NEBU
2.5000 mg | INHALATION_SOLUTION | Freq: Once | RESPIRATORY_TRACT | Status: AC
  Administered 2011-10-05: 2.5 mg via RESPIRATORY_TRACT
  Filled 2011-10-05: qty 0.5

## 2011-10-05 MED ORDER — MORPHINE SULFATE 4 MG/ML IJ SOLN
4.0000 mg | Freq: Once | INTRAMUSCULAR | Status: AC
  Administered 2011-10-05: 4 mg via INTRAVENOUS
  Filled 2011-10-05: qty 1

## 2011-10-05 MED ORDER — IPRATROPIUM BROMIDE 0.02 % IN SOLN
0.5000 mg | Freq: Once | RESPIRATORY_TRACT | Status: AC
  Administered 2011-10-05: 0.5 mg via RESPIRATORY_TRACT
  Filled 2011-10-05: qty 2.5

## 2011-10-05 MED ORDER — ASPIRIN 81 MG PO CHEW
81.0000 mg | CHEWABLE_TABLET | Freq: Once | ORAL | Status: AC
  Administered 2011-10-05: 81 mg via ORAL

## 2011-10-05 MED ORDER — SODIUM CHLORIDE 0.9 % IV SOLN
Freq: Once | INTRAVENOUS | Status: AC
  Administered 2011-10-05: 21:00:00 via INTRAVENOUS

## 2011-10-05 MED ORDER — PREDNISONE 50 MG PO TABS
50.0000 mg | ORAL_TABLET | Freq: Once | ORAL | Status: AC
  Administered 2011-10-05: 50 mg via ORAL
  Filled 2011-10-05: qty 1

## 2011-10-05 MED ORDER — ASPIRIN 81 MG PO CHEW
CHEWABLE_TABLET | ORAL | Status: AC
  Administered 2011-10-05: 81 mg via ORAL
  Filled 2011-10-05: qty 1

## 2011-10-05 NOTE — ED Notes (Signed)
RT notified to perform treatment

## 2011-10-05 NOTE — ED Provider Notes (Signed)
History     CSN: [id number] Arrival date & time: 10/05/2011  8:15 PM   None     Chief Complaint  Patient presents with  . Chest Pain    (Consider location/radiation/quality/duration/timing/severity/associated sxs/prior treatment) HPI Comments: Patient presents with day history of left anterior chest pain with radiation into back and left arm - states has had similar in the past with PNA - reports this is worse - reports cough with yellow sputum production - denies fever or chills, reports pain into left arm.  Patient is a 50 y.o. male presenting with chest pain. The history is provided by the patient. No language interpreter was used.  Chest Pain The chest pain began yesterday. Chest pain occurs constantly. The chest pain is unchanged. The pain is associated with breathing and coughing. At its most intense, the pain is at 8/10. The pain is currently at 8/10. The severity of the pain is severe. The quality of the pain is described as aching, dull, heavy and tightness. The pain radiates to the left arm. Chest pain is worsened by deep breathing. Primary symptoms include a fever, fatigue, shortness of breath and cough. Pertinent negatives for primary symptoms include no wheezing, no palpitations, no nausea, no vomiting and no dizziness.  Pertinent negatives for associated symptoms include no claudication, no diaphoresis, no lower extremity edema, no numbness, no orthopnea, no paroxysmal nocturnal dyspnea and no weakness. He tried nothing for the symptoms. Risk factors include male gender and smoking/tobacco exposure.  His past medical history is significant for COPD and hypertension.  Pertinent negatives for past medical history include no anxiety/panic attacks, no CAD, no PE, no spontaneous pneumothorax and no strokes.  Pertinent negatives for family medical history include: no CAD in family, no heart disease in family, no early MI in family and no stroke in family.  Procedure history is negative  for cardiac catheterization, echocardiogram, persantine thallium, stress echo, stress thallium and exercise treadmill test.     Past Medical History  Diagnosis Date  . Former smoker   . ETOH abuse   . Emphysema     Past Surgical History  Procedure Date  . Cervical disc surgery   . Left shoulder surgery   . Appendectomy     Family History  Problem Relation Age of Onset  . Hypertension Mother   . Lung cancer      History  Substance Use Topics  . Smoking status: Former Smoker -- 2.5 packs/day for 25 years    Types: Cigarettes    Quit date: 07/01/2011  . Smokeless tobacco: Never Used  . Alcohol Use: 18.0 oz/week    30 Cans of beer per week     6 beers a day      Review of Systems  Constitutional: Positive for fever and fatigue. Negative for diaphoresis.  HENT: Negative.   Eyes: Negative.   Respiratory: Positive for cough and shortness of breath. Negative for wheezing.   Cardiovascular: Positive for chest pain. Negative for palpitations, orthopnea and claudication.  Gastrointestinal: Negative.  Negative for nausea and vomiting.  Genitourinary: Negative.   Musculoskeletal: Positive for back pain.  Neurological: Negative.  Negative for dizziness, weakness and numbness.  Hematological: Negative.   Psychiatric/Behavioral: Negative.     Allergies  Penicillins  Home Medications   Current Outpatient Rx  Name Route Sig Dispense Refill  . IPRATROPIUM-ALBUTEROL 18-103 MCG/ACT IN AERO Inhalation Inhale 2 puffs into the lungs every 6 (six) hours as needed. Shortness of breath     .  FOLIC ACID 1 MG PO TABS Oral Take 1 mg by mouth daily.      Marland Kitchen HYDROCODONE-ACETAMINOPHEN 5-325 MG PO TABS Oral Take 1 tablet by mouth every 8 (eight) hours as needed. Used for pain     . OXYCODONE HCL 5 MG PO TABS Oral Take 5 mg by mouth every 4 (four) hours as needed. pain      BP 140/82  Pulse 113  Resp 26  SpO2 100%  Physical Exam  Nursing note and vitals reviewed. Constitutional: He  is oriented to person, place, and time. He appears well-developed and well-nourished.  HENT:  Head: Normocephalic and atraumatic.  Right Ear: External ear normal.  Left Ear: External ear normal.  Eyes: Conjunctivae are normal. Pupils are equal, round, and reactive to light.  Neck: Normal range of motion. Neck supple. No JVD present.  Cardiovascular: Normal rate, regular rhythm, normal heart sounds and intact distal pulses.  Exam reveals no gallop and no friction rub.   No murmur heard. Pulmonary/Chest: No accessory muscle usage. Tachypnea noted. No respiratory distress. He has rales in the left upper field and the left middle field. He exhibits tenderness.  Abdominal: Soft. There is tenderness.  Musculoskeletal: Normal range of motion.  Neurological: He is alert and oriented to person, place, and time. He has normal reflexes.  Skin: Skin is warm and dry. He is not diaphoretic.  Psychiatric: He has a normal mood and affect. His behavior is normal. Judgment and thought content normal.    ED Course  Procedures (including critical care time)  Labs Reviewed  BASIC METABOLIC PANEL - Abnormal; Notable for the following:    Glucose, Bld 179 (*)    GFR calc non Af Amer 84 (*)    All other components within normal limits  CBC - Abnormal; Notable for the following:    WBC 11.4 (*)    All other components within normal limits  DIFFERENTIAL  CK TOTAL AND CKMB  TROPONIN I  URINE RAPID DRUG SCREEN (HOSP PERFORMED)  URINALYSIS, ROUTINE W REFLEX MICROSCOPIC   No results found.   Atypical Chest pain, MSK back pain, Emphysema, COPD Exacerbation, Bronchitis.   MDM  Patient here with atypical chest pain, review of chart reveals that patient had a previous chest pain work up in August of this year, where it was felt the CP was MSK in nature at that time.  Two set of enzymes here are re-assuring, there are no changes in the EKG, Chest x-ray shows the emphysema and no infiltrate though I suspect a  bronchitis picture.  I will place the patient on pain control, inhalers and avelox for antibiotic.  He has no PCP because he says he does not want to fill out the paperwork, I have encouraged him to find a PCP.      Date: 10/06/2011  Rate: 99  Rhythm: normal sinus rhythm  QRS Axis: right  Intervals: normal  ST/T Wave abnormalities: nonspecific ST changes  Conduction Disutrbances:none  Narrative Interpretation:   Old EKG Reviewed: none available and unchanged     Tenishia Ekman [facility] [place], [place] 10/06/11 0200  Izola Price [place], [place] 10/06/11 0210

## 2011-10-05 NOTE — ED Notes (Signed)
Returns to room from xray 

## 2011-10-05 NOTE — ED Notes (Signed)
Respiratory to bedside.

## 2011-10-05 NOTE — ED Notes (Signed)
Pt in c/o chest pain since last night with shortness of breath, pain radiates into back and left arm

## 2011-10-05 NOTE — ED Notes (Signed)
EKG obtained in triage upon arrival

## 2011-10-06 LAB — CK TOTAL AND CKMB (NOT AT ARMC)
CK, MB: 3.1 ng/mL (ref 0.3–4.0)
Relative Index: 2.4 (ref 0.0–2.5)
Total CK: 127 U/L (ref 7–232)

## 2011-10-06 MED ORDER — ALBUTEROL SULFATE HFA 108 (90 BASE) MCG/ACT IN AERS: 2.0000 | INHALATION_SPRAY | RESPIRATORY_TRACT | Status: DC | PRN

## 2011-10-06 MED ORDER — OXYCODONE-ACETAMINOPHEN 5-325 MG PO TABS: 2.0000 | ORAL_TABLET | ORAL | Status: AC | PRN

## 2011-10-06 MED ORDER — MOXIFLOXACIN HCL 400 MG PO TABS: 400.0000 mg | ORAL_TABLET | Freq: Every day | ORAL | Status: AC

## 2011-10-08 NOTE — ED Provider Notes (Signed)
Medical screening examination/treatment/procedure(s) were performed by non-physician practitioner and as supervising physician I was immediately available for consultation/collaboration.   Forbes Cellar, MD 10/08/11 [phone]

## 2011-11-20 ENCOUNTER — Ambulatory Visit: Payer: Self-pay

## 2012-02-16 ENCOUNTER — Emergency Department (HOSPITAL_COMMUNITY)
Admission: EM | Admit: 2012-02-16 | Discharge: 2012-02-16 | Disposition: A | Discharge status: Home / Self Care | Payer: Self-pay | Attending: Emergency Medicine | Admitting: Emergency Medicine

## 2012-02-16 ENCOUNTER — Encounter (HOSPITAL_COMMUNITY): Payer: Self-pay | Admitting: Emergency Medicine

## 2012-02-16 DIAGNOSIS — J069 Acute upper respiratory infection, unspecified: Secondary | ICD-10-CM

## 2012-02-16 DIAGNOSIS — J029 Acute pharyngitis, unspecified: Secondary | ICD-10-CM

## 2012-02-16 DIAGNOSIS — F17200 Nicotine dependence, unspecified, uncomplicated: Secondary | ICD-10-CM | POA: Insufficient documentation

## 2012-02-16 DIAGNOSIS — J438 Other emphysema: Secondary | ICD-10-CM | POA: Insufficient documentation

## 2012-02-16 DIAGNOSIS — R070 Pain in throat: Secondary | ICD-10-CM | POA: Insufficient documentation

## 2012-02-16 HISTORY — DX: Chronic obstructive pulmonary disease, unspecified: J44.9

## 2012-02-16 NOTE — ED Provider Notes (Signed)
History     CSN: [id number]  Arrival date & time 02/16/12  0736   First MD Initiated Contact with Patient 02/16/12 [phone]      Chief Complaint  Patient presents with  . Sore Throat    HPI Pt was seen at 0745.  Per pt, c/o gradual onset and persistence of constant sore throat, sinus congestion, runny/stuffy nose, sneezing and cough for the past 3 weeks.  Denies fevers, no hoarse voice/stridor, no rash, no CP/SOB, no abd pain, no N/V/D.   Past Medical History  Diagnosis Date  . ETOH abuse   . Emphysema   . COPD (chronic obstructive pulmonary disease)     Past Surgical History  Procedure Date  . Cervical disc surgery   . Left shoulder surgery   . Appendectomy     Family History  Problem Relation Age of Onset  . Hypertension Mother   . Lung cancer      History  Substance Use Topics  . Smoking status: Current Everyday Smoker -- 2.5 packs/day for 25 years    Types: Cigarettes    Last Attempt to Quit: 07/01/2011  . Smokeless tobacco: Never Used  . Alcohol Use: 18.0 oz/week    30 Cans of beer per week     6 beers a day    Review of Systems ROS: Statement: All systems negative except as marked or noted in the HPI; Constitutional: Negative for fever and chills. ; ; Eyes: Negative for eye pain, redness and discharge. ; ; ENMT: Negative for ear pain, hoarseness, +rhinorrhea, nasal congestion, sinus pressure, sneezing, and sore throat. ; ; Cardiovascular: Negative for chest pain, palpitations, diaphoresis, dyspnea and peripheral edema. ; ; Respiratory: +cough. Negative for wheezing and stridor. ; ; Gastrointestinal: Negative for nausea, vomiting, diarrhea, abdominal pain, blood in stool, hematemesis, jaundice and rectal bleeding. . ; ; Genitourinary: Negative for dysuria, flank pain and hematuria. ; ; Musculoskeletal: Negative for back pain and neck pain. Negative for swelling and trauma.; ; Skin: Negative for pruritus, rash, abrasions, blisters, bruising and skin lesion.; ; Neuro:  Negative for headache, lightheadedness and neck stiffness. Negative for weakness, altered level of consciousness , altered mental status, extremity weakness, paresthesias, involuntary movement, seizure and syncope.     Allergies  Penicillins  Home Medications  No current outpatient prescriptions on file.  BP 127/87  Pulse 94  Temp(Src) 97.9 F (36.6 C) (Oral)  Resp 18  SpO2 96%  Physical Exam 0750: Physical examination:  Nursing notes reviewed; Vital signs and O2 SAT reviewed;  Constitutional: Well developed, Well nourished, Well hydrated, In no acute distress; Head:  Normocephalic, atraumatic; Eyes: EOMI, PERRL, No scleral icterus; ENMT: TM's clear bilat, +edemetous nasal turbinates bilat with clear rhinorrhea, Mouth and pharynx normal, No soft palate or post pharyngeal bulging or asymmetry, No hoarse voice, no drooling, no stridor. Mucous membranes moist; Neck: Supple, Full range of motion, No lymphadenopathy; Cardiovascular: Regular rate and rhythm, No murmur, rub, or gallop; Respiratory: Breath sounds clear & equal bilaterally, No rales, rhonchi, wheezes, Normal respiratory effort/excursion; Chest: Nontender, Movement normal; Extremities: Pulses normal, No tenderness, No edema, No calf edema or asymmetry.; Neuro: AA&Ox3, Major CN grossly intact.  No gross focal motor or sensory deficits in extremities.; Skin: Color normal, Warm, Dry, no rash, no petechiae.    ED Course  Procedures   MDM  MDM Reviewed: nursing note and vitals Interpretation: labs   Results for orders placed during the hospital encounter of 02/16/12  RAPID STREP SCREEN  Component Value Range   Streptococcus, Group A Screen (Direct) NEGATIVE  NEGATIVE      8:22 AM:  Strep negative.  No signs of peritonsillar or retropharyngeal abscess at this time.  No fevers, VSS.  Will tx symptomatically for now.  Dx testing d/w pt.  Questions answered.  Verb understanding, agreeable to d/c home with outpt  f/u.           Laray Anger, DO 02/18/12 1328

## 2012-02-16 NOTE — ED Notes (Signed)
Sore throat x 3 weeks

## 2012-02-16 NOTE — Discharge Instructions (Signed)
RESOURCE GUIDE  Dental Problems  Patients with Medicaid: [facility]                     [facility] [address]                                           [address] Phone:  [phone]                                                  Phone:  [phone]  If unable to pay or uninsured, contact:  [facility] or [facility] to become qualified for the adult dental clinic.  Chronic Pain Problems Contact [facility]  [phone] Patients need to be referred by their primary care doctor.  Insufficient Money for Medicine Contact [employer]:  call "211" or [facility] 271-5999.  No Primary Care Doctor Call [facility]  832-8000 Other agencies that provide inexpensive medical care    [facility]  [phone]    [facility]  [phone]    [facility]  [phone]    Women's Clinic  [phone]    [facility]  [phone]    [facility]  [phone]  Psychological Services [facility]  [phone] [facility]  [phone] [facility]   800 853-5163 (emergency services 641-4993)  Substance Abuse Resources [facility]  336-882-2125 [facility] 336-784-9470 [facility] 336-285-9073 [facility] 336-845-3988 Residential & Outpatient Substance Abuse Program  800-659-3381  Abuse/Neglect [place] Child Abuse Hotline (336) 641-3795 [place] Child Abuse Hotline 800-378-5315 (After Hours)  Emergency Shelter [facility] (336) 271-5985  Maternity Homes [facility] (336) 275-9566 [facility] (704) 372-4663  MRSA Hotline #:   832-7006    [place] Resources  [facility] [place]     [employer]                          [facility] [address] [place]                       [address]      [address]  [place]                                                [place]                            [place] Phone:  [phone]                                   Phone:  [phone]                 Phone:  [phone]  [facility] Phone:  [phone]    [facility] Child Abuse Hotline (847)463-0739 458-276-4974 (After Hours)    Take over the counter tylenol and ibuprofen, as directed on packaging, as needed for discomfort.  Gargle with warm water several times per day to help with discomfort.  May also use over the counter sore throat pain medicines such as chloraseptic or sucrets, as directed on packaging, as needed for discomfort.  Take over the counter sudafed, as directed on packaging, for the next week.  Use over the counter normal saline nasal spray, as instructed in the Emergency Department, several times per day for the next 2 weeks.  Call your regular medical doctor today to schedule a follow up appointment within the next week.  Return to the Emergency Department immediately if worsening.

## 2012-04-04 ENCOUNTER — Ambulatory Visit: Payer: Self-pay

## 2012-06-21 ENCOUNTER — Other Ambulatory Visit (HOSPITAL_COMMUNITY): Payer: Self-pay | Admitting: Orthopedic Surgery

## 2012-06-21 ENCOUNTER — Ambulatory Visit (HOSPITAL_COMMUNITY)
Admission: RE | Admit: 2012-06-21 | Discharge: 2012-06-21 | Disposition: A | Discharge status: Home / Self Care | Payer: Worker's Compensation | Source: Ambulatory Visit | Attending: Orthopedic Surgery | Admitting: Orthopedic Surgery

## 2012-06-21 DIAGNOSIS — M948X9 Other specified disorders of cartilage, unspecified sites: Secondary | ICD-10-CM | POA: Insufficient documentation

## 2012-06-21 DIAGNOSIS — M25519 Pain in unspecified shoulder: Secondary | ICD-10-CM

## 2014-11-04 ENCOUNTER — Encounter (HOSPITAL_COMMUNITY): Payer: Self-pay | Admitting: *Deleted

## 2014-11-04 ENCOUNTER — Emergency Department (HOSPITAL_COMMUNITY): Payer: Self-pay

## 2014-11-04 ENCOUNTER — Emergency Department (HOSPITAL_COMMUNITY)
Admission: EM | Admit: 2014-11-04 | Discharge: 2014-11-04 | Disposition: A | Discharge status: Home / Self Care | Payer: Self-pay | Attending: Emergency Medicine | Admitting: Emergency Medicine

## 2014-11-04 DIAGNOSIS — W270XXA Contact with workbench tool, initial encounter: Secondary | ICD-10-CM | POA: Insufficient documentation

## 2014-11-04 DIAGNOSIS — Y998 Other external cause status: Secondary | ICD-10-CM | POA: Insufficient documentation

## 2014-11-04 DIAGNOSIS — Z720 Tobacco use: Secondary | ICD-10-CM | POA: Insufficient documentation

## 2014-11-04 DIAGNOSIS — Z880 Allergy status to penicillin: Secondary | ICD-10-CM | POA: Insufficient documentation

## 2014-11-04 DIAGNOSIS — J449 Chronic obstructive pulmonary disease, unspecified: Secondary | ICD-10-CM | POA: Insufficient documentation

## 2014-11-04 DIAGNOSIS — Y9389 Activity, other specified: Secondary | ICD-10-CM | POA: Insufficient documentation

## 2014-11-04 DIAGNOSIS — S99922A Unspecified injury of left foot, initial encounter: Secondary | ICD-10-CM

## 2014-11-04 DIAGNOSIS — S99822A Other specified injuries of left foot, initial encounter: Secondary | ICD-10-CM | POA: Insufficient documentation

## 2014-11-04 DIAGNOSIS — H938X9 Other specified disorders of ear, unspecified ear: Secondary | ICD-10-CM | POA: Insufficient documentation

## 2014-11-04 DIAGNOSIS — Y929 Unspecified place or not applicable: Secondary | ICD-10-CM | POA: Insufficient documentation

## 2014-11-04 MED ORDER — HYDROCODONE-ACETAMINOPHEN 5-325 MG PO TABS: 1.0000 | ORAL_TABLET | ORAL | Status: DC | PRN

## 2014-11-04 MED ORDER — OXYCODONE-ACETAMINOPHEN 5-325 MG PO TABS
2.0000 | ORAL_TABLET | Freq: Once | ORAL | Status: AC
  Administered 2014-11-04: 2 via ORAL
  Filled 2014-11-04: qty 2

## 2014-11-04 NOTE — ED Notes (Signed)
Pt in c/o pain after hitting his left foot with a hammer, injury to top of foot, swelling noted

## 2014-11-04 NOTE — ED Provider Notes (Signed)
CSN: [id number]     Arrival date & time 11/04/14  1730 History  This chart was scribed for Mellody DrownLauren Egidio Lofgren, PA-C, working with Toy CookeyMegan Docherty, MD by Chestine SporeSoijett Blue, ED Scribe. The patient was seen in room TR04C/TR04C at 7:00 PM.    Chief Complaint  Patient presents with  . Foot Injury   The history is provided by the patient. No language interpreter was used.   HPI Comments: Alejandro Ferguson is a 53 y.o. male who presents to the Emergency Department complaining of left foot injury onset 12 PM today PTA. He reports that the pain came after he hit his foot with a hammer, when distracted. He was helping out an elderly neighbor when the incident occurred. Denies any other injury. Denies taking any medications for his symptoms. He denies any other symptoms. Denies seeing an orthopedist recently.  Past Medical History  Diagnosis Date  . ETOH abuse   . Emphysema   . COPD (chronic obstructive pulmonary disease)    Past Surgical History  Procedure Laterality Date  . Cervical disc surgery    . Left shoulder surgery    . Appendectomy     Family History  Problem Relation Age of Onset  . Hypertension Mother   . Lung cancer     History  Substance Use Topics  . Smoking status: Current Every Day Smoker -- 2.50 packs/day for 25 years    Types: Cigarettes    Last Attempt to Quit: 07/01/2011  . Smokeless tobacco: Never Used  . Alcohol Use: 18.0 oz/week    30 Cans of beer per week     Comment: 6 beers a day    Review of Systems  HENT: Positive for ear discharge.   Musculoskeletal: Positive for joint swelling, arthralgias and gait problem.  Skin: Negative for color change and wound.      Allergies  Penicillins  Home Medications   Prior to Admission medications   Not on File   BP 121/71 mmHg  Pulse 120  Temp(Src) 98.1 F (36.7 C)  Resp 18  Ht 5\' 11"  (1.803 m)  Wt 180 lb (81.647 kg)  BMI 25.12 kg/m2  SpO2 96% Physical Exam  Constitutional: He is oriented to person, place, and time.  He appears well-developed and well-nourished. No distress.  HENT:  Head: Normocephalic and atraumatic.  Eyes: EOM are normal.  Neck: Neck supple.  Pulmonary/Chest: Effort normal. No respiratory distress.  Musculoskeletal: Normal range of motion.       Left foot: There is tenderness and swelling. There is no crepitus, no deformity and no laceration.       Feet:  Left medial foot: Swelling and tenderness. No obvious deformity, no crepitus, no overlying ecchymosis or wound.  Neurological: He is alert and oriented to person, place, and time.  Skin: Skin is warm and dry.  Psychiatric: He has a normal mood and affect. His behavior is normal.  Nursing note and vitals reviewed.   ED Course  Procedures (including critical care time) COORDINATION OF CARE: 7:05 PM-Discussed treatment plan which includes post-op boot, crutches, pain medication, and F/u with orthopedist with pt at bedside and pt agreed to plan.   Labs Review Labs Reviewed - No data to display  Imaging Review Dg Foot Complete Left  11/04/2014   CLINICAL DATA:  53 year old male with history of trauma with injury to the medial aspect of the left foot from a hammer strike today at noon.  EXAM: LEFT FOOT - COMPLETE 3+ VIEW  COMPARISON:  No priors.  FINDINGS: Multiple views of the left foot demonstrate no acute displaced fracture, subluxation, dislocation, or soft tissue abnormality.  IMPRESSION: No acute radiographic abnormality of the left foot.   Electronically Signed   By: Trudie Reedaniel  Entrikin M.D.   On: 11/04/2014 18:38     EKG Interpretation None      MDM   Final diagnoses:  Foot injury, left, initial encounter   Patient with injury to left foot, sustained by hitting self with hammer earlier today. Patient reports pain with ambulation. No obvious deformity negative x-rays. Plan to discharge home with postop shoe, crutches, or the follow-up, ice, narcotic pain medication, anti-inflammatories over-the-counter. Discussed imaging  results, and treatment plan with the patient. Return precautions given. Reports understanding and no other concerns at this time.  Patient is stable for discharge at this time. Meds given in ED:  Medications  oxyCODONE-acetaminophen (PERCOCET/ROXICET) 5-325 MG per tablet 2 tablet (not administered)    New Prescriptions   HYDROCODONE-ACETAMINOPHEN (NORCO/VICODIN) 5-325 MG PER TABLET    Take 1 tablet by mouth every 4 (four) hours as needed for moderate pain or severe pain.   I personally performed the services described in this documentation, which was scribed in my presence. The recorded information has been reviewed and is accurate.    Mellody DrownLauren Malynda Smolinski, PA-C 11/04/14 1932  Mellody DrownLauren Anthonee Gelin, PA-C 11/04/14 [id number]  Toy CookeyMegan Docherty, MD 11/05/14 [phone]

## 2014-11-04 NOTE — Discharge Instructions (Signed)
Call for a follow up appointment with a Family or Primary Care Provider.  Return if Symptoms worsen.   Take medication as prescribed.  Ice your foot 3-4 times a day. Ibuprofen for discomfort 800 mg 3 times a day.

## 2015-06-22 ENCOUNTER — Inpatient Hospital Stay (HOSPITAL_COMMUNITY): Payer: Self-pay

## 2015-06-22 ENCOUNTER — Emergency Department (HOSPITAL_COMMUNITY): Payer: Self-pay

## 2015-06-22 ENCOUNTER — Inpatient Hospital Stay (HOSPITAL_COMMUNITY)
Admission: EM | Admit: 2015-06-22 | Discharge: 2015-06-24 | DRG: 195 | Disposition: A | Discharge status: Home / Self Care | Payer: Self-pay | Attending: Internal Medicine | Admitting: Internal Medicine

## 2015-06-22 ENCOUNTER — Encounter (HOSPITAL_COMMUNITY): Payer: Self-pay | Admitting: Emergency Medicine

## 2015-06-22 DIAGNOSIS — J449 Chronic obstructive pulmonary disease, unspecified: Secondary | ICD-10-CM | POA: Diagnosis present

## 2015-06-22 DIAGNOSIS — J189 Pneumonia, unspecified organism: Principal | ICD-10-CM | POA: Diagnosis present

## 2015-06-22 DIAGNOSIS — F1910 Other psychoactive substance abuse, uncomplicated: Secondary | ICD-10-CM | POA: Diagnosis present

## 2015-06-22 DIAGNOSIS — F17210 Nicotine dependence, cigarettes, uncomplicated: Secondary | ICD-10-CM | POA: Diagnosis present

## 2015-06-22 DIAGNOSIS — R05 Cough: Secondary | ICD-10-CM

## 2015-06-22 DIAGNOSIS — R042 Hemoptysis: Secondary | ICD-10-CM

## 2015-06-22 DIAGNOSIS — R059 Cough, unspecified: Secondary | ICD-10-CM

## 2015-06-22 HISTORY — DX: Pneumonia, unspecified organism: J18.9

## 2015-06-22 LAB — URINALYSIS, ROUTINE W REFLEX MICROSCOPIC
Bilirubin Urine: NEGATIVE
Glucose, UA: NEGATIVE mg/dL
Hgb urine dipstick: NEGATIVE
KETONES UR: NEGATIVE mg/dL
Leukocytes, UA: NEGATIVE
NITRITE: NEGATIVE
PROTEIN: NEGATIVE mg/dL
Specific Gravity, Urine: 1.014 (ref 1.005–1.030)
UROBILINOGEN UA: 1 mg/dL (ref 0.0–1.0)
pH: 6 (ref 5.0–8.0)

## 2015-06-22 LAB — CBC WITH DIFFERENTIAL/PLATELET
Basophils Absolute: 0 10*3/uL (ref 0.0–0.1)
Basophils Relative: 0 % (ref 0–1)
EOS PCT: 1 % (ref 0–5)
Eosinophils Absolute: 0.1 10*3/uL (ref 0.0–0.7)
HEMATOCRIT: 46.3 % (ref 39.0–52.0)
Hemoglobin: 15.7 g/dL (ref 13.0–17.0)
LYMPHS ABS: 1.5 10*3/uL (ref 0.7–4.0)
Lymphocytes Relative: 14 % (ref 12–46)
MCH: 32 pg (ref 26.0–34.0)
MCHC: 33.9 g/dL (ref 30.0–36.0)
MCV: 94.5 fL (ref 78.0–100.0)
MONO ABS: 1.1 10*3/uL — AB (ref 0.1–1.0)
Monocytes Relative: 10 % (ref 3–12)
NEUTROS ABS: 7.8 10*3/uL — AB (ref 1.7–7.7)
Neutrophils Relative %: 75 % (ref 43–77)
PLATELETS: 238 10*3/uL (ref 150–400)
RBC: 4.9 MIL/uL (ref 4.22–5.81)
RDW: 13.5 % (ref 11.5–15.5)
WBC: 10.5 10*3/uL (ref 4.0–10.5)

## 2015-06-22 LAB — COMPREHENSIVE METABOLIC PANEL
ALBUMIN: 3.7 g/dL (ref 3.5–5.0)
ALK PHOS: 82 U/L (ref 38–126)
ALT: 15 U/L — AB (ref 17–63)
AST: 23 U/L (ref 15–41)
Anion gap: 13 (ref 5–15)
BUN: 5 mg/dL — ABNORMAL LOW (ref 6–20)
CO2: 20 mmol/L — AB (ref 22–32)
Calcium: 8.7 mg/dL — ABNORMAL LOW (ref 8.9–10.3)
Chloride: 100 mmol/L — ABNORMAL LOW (ref 101–111)
Creatinine, Ser: 0.93 mg/dL (ref 0.61–1.24)
GFR calc Af Amer: >60 mL/min (ref >60)
GFR calc non Af Amer: >60 mL/min (ref >60)
Glucose, Bld: 104 mg/dL — ABNORMAL HIGH (ref 65–99)
Potassium: 3.9 mmol/L (ref 3.5–5.1)
SODIUM: 133 mmol/L — AB (ref 135–145)
TOTAL PROTEIN: 7 g/dL (ref 6.5–8.1)
Total Bilirubin: 1.3 mg/dL — ABNORMAL HIGH (ref 0.3–1.2)

## 2015-06-22 LAB — RAPID URINE DRUG SCREEN, HOSP PERFORMED
AMPHETAMINES: NOT DETECTED
Barbiturates: NOT DETECTED
Benzodiazepines: NOT DETECTED
Cocaine: NOT DETECTED
Opiates: NOT DETECTED
TETRAHYDROCANNABINOL: POSITIVE — AB

## 2015-06-22 LAB — I-STAT TROPONIN, ED: TROPONIN I, POC: 0 ng/mL (ref 0.00–0.08)

## 2015-06-22 LAB — STREP PNEUMONIAE URINARY ANTIGEN: STREP PNEUMO URINARY ANTIGEN: NEGATIVE

## 2015-06-22 LAB — I-STAT CG4 LACTIC ACID, ED: Lactic Acid, Venous: 0.78 mmol/L (ref 0.5–2.0)

## 2015-06-22 MED ORDER — MORPHINE SULFATE 4 MG/ML IJ SOLN
4.0000 mg | Freq: Once | INTRAMUSCULAR | Status: AC
  Administered 2015-06-22: 4 mg via INTRAVENOUS
  Filled 2015-06-22: qty 1

## 2015-06-22 MED ORDER — IPRATROPIUM BROMIDE 0.02 % IN SOLN
0.5000 mg | Freq: Once | RESPIRATORY_TRACT | Status: AC
  Administered 2015-06-22: 0.5 mg via RESPIRATORY_TRACT
  Filled 2015-06-22: qty 2.5

## 2015-06-22 MED ORDER — ACETAMINOPHEN 325 MG PO TABS
650.0000 mg | ORAL_TABLET | Freq: Once | ORAL | Status: AC
  Administered 2015-06-22: 650 mg via ORAL

## 2015-06-22 MED ORDER — LEVOFLOXACIN IN D5W 750 MG/150ML IV SOLN
750.0000 mg | Freq: Once | INTRAVENOUS | Status: AC
  Administered 2015-06-22: 750 mg via INTRAVENOUS
  Filled 2015-06-22: qty 150

## 2015-06-22 MED ORDER — SODIUM CHLORIDE 0.9 % IV BOLUS (SEPSIS)
1000.0000 mL | Freq: Once | INTRAVENOUS | Status: AC
  Administered 2015-06-22: 1000 mL via INTRAVENOUS

## 2015-06-22 MED ORDER — ONDANSETRON HCL 4 MG/2ML IJ SOLN
4.0000 mg | Freq: Once | INTRAMUSCULAR | Status: AC
  Administered 2015-06-22: 4 mg via INTRAVENOUS
  Filled 2015-06-22: qty 2

## 2015-06-22 MED ORDER — IOHEXOL 300 MG/ML  SOLN
75.0000 mL | Freq: Once | INTRAMUSCULAR | Status: AC | PRN
  Administered 2015-06-22: 75 mL via INTRAVENOUS

## 2015-06-22 MED ORDER — MORPHINE SULFATE 2 MG/ML IJ SOLN
2.0000 mg | INTRAMUSCULAR | Status: DC | PRN
Start: 2015-06-22 — End: 2015-06-24
  Administered 2015-06-22 – 2015-06-24 (×5): 2 mg via INTRAVENOUS
  Filled 2015-06-22 (×5): qty 1

## 2015-06-22 MED ORDER — LORAZEPAM 2 MG/ML IJ SOLN
1.0000 mg | Freq: Once | INTRAMUSCULAR | Status: AC
  Administered 2015-06-22: 1 mg via INTRAVENOUS
  Filled 2015-06-22: qty 1

## 2015-06-22 MED ORDER — ALBUTEROL SULFATE (2.5 MG/3ML) 0.083% IN NEBU
5.0000 mg | INHALATION_SOLUTION | Freq: Once | RESPIRATORY_TRACT | Status: AC
  Administered 2015-06-22: 5 mg via RESPIRATORY_TRACT
  Filled 2015-06-22: qty 6

## 2015-06-22 NOTE — ED Notes (Signed)
Reported temp to [place], [place]. No new orders, planning for admisison.

## 2015-06-22 NOTE — ED Notes (Signed)
SpO2 during ambulation hovered around 96%, with sporadic drops to around 87% for approximately 30 seconds each.  Ambulated around POD A approximately 100 paces. Pt ambulated successfully without assistance.

## 2015-06-22 NOTE — ED Notes (Signed)
Lisa, PA-C, at the bedside.  

## 2015-06-22 NOTE — ED Provider Notes (Signed)
CSN: [id number]     Arrival date & time 06/22/15  1550 History   First MD Initiated Contact with Patient 06/22/15 1901     Chief Complaint  Patient presents with  . 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  He has had associated sweats and chills for approximately one week, and hemoptysis for the past 2 days.  He is having frequent coughing spells with diaphoresis. He denies any recent travel or sick contacts.  He does complain of recent URI sx.  He denies any cardiac history, denies any lower extremity edema, no palpitations.  He denies any shortness of breath other then with his coughing spells when he has to catch his breath afterwards. He has had general malaise, with recent decrease in his alcohol intake and smoking frequency.  Reported to nurse- nausea and vomiting for 2 days.  Currently denies any abdominal pain, vomiting or diarrhea.  He denies any near syncope, orthopnea, PND.  He was admitted to the hospital for pneumonia two years ago, states he followed up with pulmonology at that time to watch pulmonary nodules, which resolved after several months.  He does not currently have a job and is concerned with ability to afford medicine.     Past Medical History  Diagnosis Date  . ETOH abuse   . Emphysema   . COPD (chronic obstructive pulmonary disease)   . Pneumonia    Past Surgical History  Procedure Laterality Date  . Cervical disc surgery    . Left shoulder surgery    . Appendectomy     Family History  Problem Relation  Age of Onset  . Hypertension Mother   . Lung cancer     History  Substance Use Topics  . Smoking status: Current Every Day Smoker -- 2.50 packs/day for 25 years    Types: Cigarettes    Last Attempt to Quit: 07/01/2011  . Smokeless tobacco: Never Used  . Alcohol Use: 18.0 oz/week    30 Cans of beer per week     Comment: 6 beers a day    Review of Systems  Constitutional: Positive for fever and diaphoresis. Negative for fatigue.  HENT: Negative.   Eyes: Negative.   Respiratory: Positive for cough. Negative for choking, chest tightness, wheezing and stridor.   Cardiovascular: Negative for palpitations and leg swelling.  Gastrointestinal: Negative.   Genitourinary: Negative.   Musculoskeletal: Positive for back pain. Negative for myalgias, joint swelling, arthralgias, gait problem, neck pain and neck stiffness.  Skin: Negative.   Neurological: Negative.   Psychiatric/Behavioral: Negative.    Allergies  Penicillins  Home Medications   Prior to Admission medications   Medication Sig Start Date End Date Taking? Authorizing Provider  HYDROcodone-acetaminophen (NORCO/VICODIN) 5-325 MG per tablet Take 1 tablet by mouth every 4 (four) hours as needed for moderate pain or severe pain. 11/04/14   Lauren Parker, PA-C   BP 123/79 mmHg  Pulse 101  Temp(Src) 99 F (37.2 C) (Oral)  Resp 18  Ht 5\' 11"  (1.803 m)  Wt 190 lb (86.183 kg)  BMI 26.51 kg/m2  SpO2 97% Physical Exam  Constitutional: He is oriented to person, place, and time. He appears well-developed and well-nourished. No distress.  Patient appears older than stated age, sitting in ER gurney, appears uncomfortable  HENT:  Head: Normocephalic and atraumatic.  Nose: Nose normal.  Mouth/Throat: Oropharynx is clear and moist. No oropharyngeal exudate.  Eyes: Conjunctivae and EOM are normal. Pupils are equal, round, and reactive to light. Right eye exhibits no discharge. Left eye exhibits no discharge. No scleral icterus.  Neck:  Normal range of motion. No JVD present. No tracheal deviation present. No thyromegaly present.  Cardiovascular: Normal rate, regular rhythm, normal heart sounds and intact distal pulses.  Exam reveals no gallop and no friction rub.   No murmur heard. Pulses symmetrical, radial 2+, DP 2+  Pulmonary/Chest: Effort normal. No respiratory distress. He has no wheezes. He exhibits no tenderness.  Frequent cough, coarse lung sounds throughout  Abdominal: Soft. Bowel sounds are normal. He exhibits no distension and no mass. There is no tenderness. There is no rebound and no guarding.  Musculoskeletal: Normal range of motion. He exhibits no edema or tenderness.  Lymphadenopathy:    He has no cervical adenopathy.  Neurological: He is alert and oriented to person, place, and time. He has normal reflexes. No cranial nerve deficit. He exhibits normal muscle tone. Coordination normal.  Skin: Skin is warm. No rash noted. He is diaphoretic. No erythema. No pallor.  Psychiatric: He has a normal mood and affect. His behavior is normal. Judgment and thought content normal.  Nursing note and vitals reviewed.    ED Course  Procedures (including critical care time) Labs Review Labs Reviewed  CBC WITH DIFFERENTIAL/PLATELET - Abnormal; Notable for the following:    Neutro Abs 7.8 (*)    Monocytes Absolute 1.1 (*)    All other components within normal limits  COMPREHENSIVE METABOLIC PANEL - Abnormal; Notable for the following:    Sodium 133 (*)    Chloride 100 (*)    CO2 20 (*)    Glucose, Bld 104 (*)    BUN 5 (*)    Calcium 8.7 (*)    ALT 15 (*)    Total Bilirubin 1.3 (*)    All other components within normal limits  URINE RAPID DRUG SCREEN, HOSP PERFORMED - Abnormal; Notable for the following:    Tetrahydrocannabinol POSITIVE (*)    All other components within normal limits  CULTURE, BLOOD (ROUTINE X 2)  CULTURE, BLOOD (ROUTINE X 2)  CULTURE, EXPECTORATED SPUTUM-ASSESSMENT  GRAM STAIN  URINALYSIS,  ROUTINE W REFLEX MICROSCOPIC (NOT AT [facility])  STREP PNEUMONIAE URINARY ANTIGEN  LEGIONELLA ANTIGEN, URINE  HIV ANTIBODY (ROUTINE TESTING)  CBC WITH DIFFERENTIAL/PLATELET  TROPONIN I  I-STAT TROPOININ, ED  I-STAT CG4 LACTIC ACID, ED    Imaging Review Dg Chest 2 View  06/22/2015   CLINICAL DATA:  Cough, shortness of breath, and chest pain for 1 week. History of COPD. Smoker.  EXAM: CHEST  2 VIEW  COMPARISON:  06/22/2015 at 1638 hr  FINDINGS: Cardiomediastinal silhouette is within normal limits. The lungs remain hyperinflated with evidence of emphysema including bullous changes in the left greater than right apices. Hazy opacity projecting over the lateral left mid lung on the earlier study today persists on the current examination. There is no evidence of new airspace consolidation, overt pulmonary edema, pleural effusion, or pneumothorax. No acute osseous abnormality is identified.  IMPRESSION: Persistent hazy left midlung opacity, unchanged from study earlier today. Again, this could reflect developing pneumonia and follow-up PA and lateral  chest radiographs are recommended in 3-4 weeks to ensure resolution.   Electronically Signed   By: Sebastian Ache   On: 06/22/2015 20:29   Dg Chest 2 View  06/22/2015   CLINICAL DATA:  Right-sided chest pain radiating to the back, cough and chills, hemoptysis  EXAM: CHEST  2 VIEW  COMPARISON:  10/05/2011  FINDINGS: Diffuse emphysematous change is noted. Ill-defined hazy opacity is identified over the left posterior ninth rib, possibly superimposition of shadows although pulmonary parenchymal opacity could appear similar. No pleural effusion. No pneumothorax. Heart size is normal.  IMPRESSION: Severe emphysematous change reidentified with new patchy hazy airspace opacity over the left lung base which could be artifactual but could also indicate early pneumonia. Followup PA and lateral chest X-ray is recommended in 3-4 weeks following trial of antibiotic therapy to  ensure resolution and exclude underlying malignancy.   Electronically Signed   By: Christiana Pellant M.D.   On: 06/22/2015 16:48     EKG Interpretation   Date/Time:  Saturday June 22 2015 15:56:40 EDT Ventricular Rate:  105 PR Interval:  128 QRS Duration: 92 QT Interval:  320 QTC Calculation: 422 R Axis:   85 Text Interpretation:  Sinus tachycardia Possible Right ventricular  hypertrophy Septal infarct , age undetermined Abnormal ECG agree. no STEMI  Confirmed by Donnald Garre, MD, Lebron Conners [phone]) on 06/22/2015 9:55:30 PM      MDM   Final diagnoses:  Cough  Hemoptysis    Pt with CAP, hypoxia with ambulation, given pt's pack-year history, environmental exposures, ETOH and polysubstance abuse, pt may need to be admitted for treatment.    New oxygen requirement with ambulation - patient was 87% on room air Patient has had increasing temperature here in the ER after Tylenol administration, has also applied several blankets, complaining of feeling cold and shivering after IV fluids.  Labs pertinent for mild hyponatremia, hypochloremia, chest x-ray suggestive of a left middle lobe infiltrate  Patient has been discussed with Dr. Donnald Garre, who has called for admission for community acquired pneumonia. Blood cultures, lactic acid, Abx initiated, pt admitted to medicine             Danelle Berry, PA-C 06/26/15 6045  Arby Barrette, MD 07/08/15 [phone]

## 2015-06-22 NOTE — ED Notes (Signed)
Patient in CT

## 2015-06-22 NOTE — ED Notes (Signed)
Admitting Dr in room 

## 2015-06-22 NOTE — ED Notes (Signed)
Attempted to call report

## 2015-06-22 NOTE — H&P (Signed)
History and Physical  Alejandro Ferguson CXK:[id number] DOB: Oct 19, 1961 DOA: 06/22/2015  PCP: No PCP Per Patient   Chief Complaint: Dyspnea and cough  HPI: Alejandro Ferguson is a 54 y.o. male with history of polysubstance abuse who came with cc of cough and dyspnea for the past week. He said his cough was productive of clear phlegm initially but he had hemoptysis over the last two days. He had fever and chills. He had right side chest pain induced by cough. He denied N/V/D/C/abd pain/dyuria/dizziness. He had loss of appetite. He was smoking weed and cocaine 2 weeks ago. He smokes 1-2 PPD and has been smoking for the past 30 years.     Review of Systems:  12 points review of systems were negative except as per HPI.   Past Medical History  Diagnosis Date  . ETOH abuse   . Emphysema   . COPD (chronic obstructive pulmonary disease)   . Pneumonia    Past Surgical History  Procedure Laterality Date  . Cervical disc surgery    . Left shoulder surgery    . Appendectomy     Social History:  reports that he has been smoking Cigarettes.  He has a 62.5 pack-year smoking history. He has never used smokeless tobacco. He reports that he drinks about 18.0 oz of alcohol per week. He reports that he does not use illicit drugs.   Allergies  Allergen Reactions  . Penicillins     unknown    Family History  Problem Relation Age of Onset  . Hypertension Mother   . Lung cancer       Prior to Admission medications   Medication Sig Start Date End Date Taking? Authorizing Provider  HYDROcodone-acetaminophen (NORCO/VICODIN) 5-325 MG per tablet Take 1 tablet by mouth every 4 (four) hours as needed for moderate pain or severe pain. 11/04/14   Mellody Drown, PA-C    Physical Exam: BP 121/75 mmHg  Pulse 107  Temp(Src) 100.4 F (38 C) (Oral)  Resp 16  Ht 5\' 11"  (1.803 m)  Wt 83.547 kg (184 lb 3 oz)  BMI 25.70 kg/m2  SpO2 86%  General:  In  Mild distress. Eyes: non icteric.  ENT: dry mucous membranes.    Neck: supple Cardiovascular: tachycardic, no M/G/R. Respiratory: decreased breathing sounds. No wheezing. Mild bronchial sounds in left lower lung  Abdomen: soft , BS+, non tender.  Skin: no rash.  Musculoskeletal: No deformity.  Neurologic: alert, O#3, no focal deficits.           Labs on Admission:  Basic Metabolic Panel:  Recent Labs Lab 06/22/15 1750  NA 133*  K 3.9  CL 100*  CO2 20*  GLUCOSE 104*  BUN 5*  CREATININE 0.93  CALCIUM 8.7*   Liver Function Tests:  Recent Labs Lab 06/22/15 1750  AST 23  ALT 15*  ALKPHOS 82  BILITOT 1.3*  PROT 7.0  ALBUMIN 3.7   No results for input(s): LIPASE, AMYLASE in the last 168 hours. No results for input(s): AMMONIA in the last 168 hours. CBC:  Recent Labs Lab 06/22/15 1750  WBC 10.5  NEUTROABS 7.8*  HGB 15.7  HCT 46.3  MCV 94.5  PLT 238   Cardiac Enzymes: No results for input(s): CKTOTAL, CKMB, CKMBINDEX, TROPONINI in the last 168 hours.  BNP (last 3 results) No results for input(s): BNP in the last 8760 hours.  ProBNP (last 3 results) No results for input(s): PROBNP in the last 8760 hours.  CBG: No results  for input(s): GLUCAP in the last 168 hours.  Radiological Exams on Admission: Dg Chest 2 View  06/22/2015   CLINICAL DATA:  Cough, shortness of breath, and chest pain for 1 week. History of COPD. Smoker.  EXAM: CHEST  2 VIEW  COMPARISON:  06/22/2015 at 1638 hr  FINDINGS: Cardiomediastinal silhouette is within normal limits. The lungs remain hyperinflated with evidence of emphysema including bullous changes in the left greater than right apices. Hazy opacity projecting over the lateral left mid lung on the earlier study today persists on the current examination. There is no evidence of new airspace consolidation, overt pulmonary edema, pleural effusion, or pneumothorax. No acute osseous abnormality is identified.  IMPRESSION: Persistent hazy left midlung opacity, unchanged from study earlier today. Again,  this could reflect developing pneumonia and follow-up PA and lateral chest radiographs are recommended in 3-4 weeks to ensure resolution.   Electronically Signed   By: Sebastian Ache   On: 06/22/2015 20:29   Dg Chest 2 View  06/22/2015   CLINICAL DATA:  Right-sided chest pain radiating to the back, cough and chills, hemoptysis  EXAM: CHEST  2 VIEW  COMPARISON:  10/05/2011  FINDINGS: Diffuse emphysematous change is noted. Ill-defined hazy opacity is identified over the left posterior ninth rib, possibly superimposition of shadows although pulmonary parenchymal opacity could appear similar. No pleural effusion. No pneumothorax. Heart size is normal.  IMPRESSION: Severe emphysematous change reidentified with new patchy hazy airspace opacity over the left lung base which could be artifactual but could also indicate early pneumonia. Followup PA and lateral chest X-ray is recommended in 3-4 weeks following trial of antibiotic therapy to ensure resolution and exclude underlying malignancy.   Electronically Signed   By: Christiana Pellant M.D.   On: 06/22/2015 16:48    EKG: Independently reviewed. EKG with no ischemic T/ST changes.   Assessment/Plan  Community acquired pneumonia:  Started on levofloxacin 750 mg daily Sputum cx, blood cx were sent Legionella and strep pneumon Ag in urine ;sent IVF Morphine prn pain Related to COPD and substance abuse  COPD:  Started on albuterol Q4H prn.  Pulm consult tomorrow  Hemoptysis :  likely due to CAP.  Well score 2.5 : low vs. Moderate risk Will check CTPE   Consultants: Pulmonary in am.   Code Status: Full   Family Communication: None at bedside.   Disposition Plan: home once stable, evaluated by pulm.     Eston Esters MD [facility]

## 2015-06-22 NOTE — ED Notes (Addendum)
Placed pt on 2L of O2, per [facility] - RN.

## 2015-06-22 NOTE — ED Notes (Signed)
Called Lisa,pa-c, for request for pain medication.

## 2015-06-22 NOTE — ED Notes (Signed)
Patient returend from xray

## 2015-06-22 NOTE — ED Notes (Signed)
Pt c/o cold symptoms with non productive cough x's 1 week.  St's feels like he can't take a deep breath.  Also c/o mid upper chest pain.  Nausea with vomiting since yesterday.  St's there was blood in emesis.

## 2015-06-23 ENCOUNTER — Encounter (HOSPITAL_COMMUNITY): Payer: Self-pay | Admitting: *Deleted

## 2015-06-23 DIAGNOSIS — R05 Cough: Secondary | ICD-10-CM

## 2015-06-23 LAB — CBC WITH DIFFERENTIAL/PLATELET
Basophils Absolute: 0.1 10*3/uL (ref 0.0–0.1)
Basophils Relative: 1 % (ref 0–1)
Eosinophils Absolute: 0.1 10*3/uL (ref 0.0–0.7)
Eosinophils Relative: 1 % (ref 0–5)
HCT: 41.7 % (ref 39.0–52.0)
HEMOGLOBIN: 13.9 g/dL (ref 13.0–17.0)
LYMPHS ABS: 1.4 10*3/uL (ref 0.7–4.0)
LYMPHS PCT: 16 % (ref 12–46)
MCH: 31.5 pg (ref 26.0–34.0)
MCHC: 33.3 g/dL (ref 30.0–36.0)
MCV: 94.6 fL (ref 78.0–100.0)
MONOS PCT: 14 % — AB (ref 3–12)
Monocytes Absolute: 1.2 10*3/uL — ABNORMAL HIGH (ref 0.1–1.0)
NEUTROS PCT: 68 % (ref 43–77)
Neutro Abs: 6.1 10*3/uL (ref 1.7–7.7)
PLATELETS: 221 10*3/uL (ref 150–400)
RBC: 4.41 MIL/uL (ref 4.22–5.81)
RDW: 13.4 % (ref 11.5–15.5)
WBC: 8.8 10*3/uL (ref 4.0–10.5)

## 2015-06-23 LAB — HIV ANTIBODY (ROUTINE TESTING W REFLEX): HIV Screen 4th Generation wRfx: NONREACTIVE

## 2015-06-23 LAB — TROPONIN I

## 2015-06-23 MED ORDER — ALBUTEROL SULFATE (2.5 MG/3ML) 0.083% IN NEBU: 2.5000 mg | INHALATION_SOLUTION | RESPIRATORY_TRACT | Status: DC | PRN

## 2015-06-23 MED ORDER — SODIUM CHLORIDE 0.9 % IV SOLN
INTRAVENOUS | Status: DC
  Administered 2015-06-23: 01:00:00 via INTRAVENOUS

## 2015-06-23 MED ORDER — ONDANSETRON HCL 4 MG/2ML IJ SOLN: 4.0000 mg | Freq: Three times a day (TID) | INTRAMUSCULAR | Status: DC | PRN

## 2015-06-23 MED ORDER — ZOLPIDEM TARTRATE 5 MG PO TABS
5.0000 mg | ORAL_TABLET | Freq: Every day | ORAL | Status: DC
  Administered 2015-06-23 (×2): 5 mg via ORAL
  Filled 2015-06-23 (×2): qty 1

## 2015-06-23 MED ORDER — ENOXAPARIN SODIUM 40 MG/0.4ML ~~LOC~~ SOLN
40.0000 mg | SUBCUTANEOUS | Status: DC
  Filled 2015-06-23: qty 0.4

## 2015-06-23 MED ORDER — GUAIFENESIN-CODEINE 100-10 MG/5ML PO SOLN
5.0000 mL | Freq: Four times a day (QID) | ORAL | Status: DC | PRN
  Administered 2015-06-23 – 2015-06-24 (×3): 5 mL via ORAL
  Filled 2015-06-23 (×3): qty 5

## 2015-06-23 MED ORDER — LEVOFLOXACIN IN D5W 750 MG/150ML IV SOLN
750.0000 mg | INTRAVENOUS | Status: DC
  Administered 2015-06-23: 750 mg via INTRAVENOUS
  Filled 2015-06-23 (×2): qty 150

## 2015-06-23 NOTE — Progress Notes (Signed)
[facility] PROGRESS NOTE  Alejandro Ferguson ZOX:[id number] DOB: 01/20/1961 DOA: 06/22/2015 PCP: No PCP Per Patient  Assessment/Plan: Active Problems:   Pneumonia   Community acquired pneumonia/ crack  lung Continue levofloxacin, oxygen requirements stable Follow blood culture results,follow sputum culture results HIV antibody in process, urine strep pneumo negative, urine Legionella antigen pending Continue IV fluids   COPDWithout exacerbation: Continue when necessary nebulizer treatments   Hemoptysis : re solved  likely due to CAP. Crack lung  Well score 2.5 : low vs. Moderate risk CT scan shows left upper and right lower lobe pneumonia   Code Status:      Code Status Orders        Start     Ordered   06/23/15 0007  Full code   Continuous     06/23/15 0006     Family Communication: family updated about patient's clinical progress Disposition Plan:Discharge tomorrow   Brief narrative: Alejandro Ferguson is a 54 y.o. male with history of polysubstance abuse who came with cc of cough and dyspnea for the past week. He said his cough was productive of clear phlegm initially but he had hemoptysis over the last two days. He had fever and chills. He had right side chest pain induced by cough. He denied N/V/D/C/abd pain/dyuria/dizziness. He had loss of appetite. He was smoking weed and cocaine 2 weeks ago. He smokes 1-2 PPD and has been smoking for the past 30 years.   Consultants:  None  Procedures:  None  Antibiotics: Anti-infectives    Start     Dose/Rate Route Frequency Ordered Stop   06/23/15 2100  levofloxacin (LEVAQUIN) IVPB 750 mg     750 mg 100 mL/hr over 90 Minutes Intravenous Every 24 hours 06/23/15 0006     06/22/15 2130  levofloxacin (LEVAQUIN) IVPB 750 mg     750 mg 100 mL/hr over 90 Minutes Intravenous  Once 06/22/15 2130 06/22/15 2312         HPI/Subjective: Continues to have a productive cough, afebrile overnight, denies any chest pain,  "feels miserable"  Objective: Filed Vitals:   06/22/15 2314 06/22/15 2315 06/23/15 0016 06/23/15 0538  BP: 108/64 106/62 123/79 97/73  Pulse: 111 109 101 91  Temp: 99.5 F (37.5 C)  99 F (37.2 C) 99.1 F (37.3 C)  TempSrc: Oral  Oral Oral  Resp: 14 14 18 18   Height:   5\' 11"  (1.803 m)   Weight:   86.183 kg (190 lb)   SpO2: 92% 90% 97% 95%    Intake/Output Summary (Last 24 hours) at 06/23/15 8119 Last data filed at 06/23/15 1478  Gross per 24 hour  Intake      0 ml  Output    400 ml  Net   -400 ml    Exam:  General: No acute respiratory distress Lungs: Clear to auscultation bilaterally without wheezes or crackles Cardiovascular: Regular rate and rhythm without murmur gallop or rub normal S1 and S2 Abdomen: Nontender, nondistended, soft, bowel sounds positive, no rebound, no ascites, no appreciable mass Extremities: No significant cyanosis, clubbing, or edema bilateral lower extremities     Data Review   Micro Results Recent Results (from the past 240 hour(s))  Culture, blood (routine x 2)     Status: None (Preliminary result)   Collection Time: 06/22/15 10:20 PM  Result Value Ref Range Status   Specimen Description BLOOD RIGHT ANTECUBITAL  Final   Special Requests BOTTLES DRAWN AEROBIC AND  ANAEROBIC 5CC  Final   Culture PENDING  Incomplete   Report Status PENDING  Incomplete    Radiology Reports Dg Chest 2 View  06/22/2015   CLINICAL DATA:  Cough, shortness of breath, and chest pain for 1 week. History of COPD. Smoker.  EXAM: CHEST  2 VIEW  COMPARISON:  06/22/2015 at 1638 hr  FINDINGS: Cardiomediastinal silhouette is within normal limits. The lungs remain hyperinflated with evidence of emphysema including bullous changes in the left greater than right apices. Hazy opacity projecting over the lateral left mid lung on the earlier study today persists on the current examination. There is no evidence of new airspace consolidation, overt pulmonary edema, pleural  effusion, or pneumothorax. No acute osseous abnormality is identified.  IMPRESSION: Persistent hazy left midlung opacity, unchanged from study earlier today. Again, this could reflect developing pneumonia and follow-up PA and lateral chest radiographs are recommended in 3-4 weeks to ensure resolution.   Electronically Signed   By: Sebastian Ache   On: 06/22/2015 20:29   Dg Chest 2 View  06/22/2015   CLINICAL DATA:  Right-sided chest pain radiating to the back, cough and chills, hemoptysis  EXAM: CHEST  2 VIEW  COMPARISON:  10/05/2011  FINDINGS: Diffuse emphysematous change is noted. Ill-defined hazy opacity is identified over the left posterior ninth rib, possibly superimposition of shadows although pulmonary parenchymal opacity could appear similar. No pleural effusion. No pneumothorax. Heart size is normal.  IMPRESSION: Severe emphysematous change reidentified with new patchy hazy airspace opacity over the left lung base which could be artifactual but could also indicate early pneumonia. Followup PA and lateral chest X-ray is recommended in 3-4 weeks following trial of antibiotic therapy to ensure resolution and exclude underlying malignancy.   Electronically Signed   By: Christiana Pellant M.D.   On: 06/22/2015 16:48   Ct Chest W Contrast  06/23/2015   CLINICAL DATA:  54 year old male with hemoptysis and a cough  EXAM: CT CHEST WITH CONTRAST  TECHNIQUE: Multidetector CT imaging of the chest was performed during intravenous contrast administration.  CONTRAST:  75mL OMNIPAQUE IOHEXOL 300 MG/ML  SOLN  COMPARISON:  Chest radiograph dated 06/22/2015  FINDINGS: Paraseptal and centrilobular emphysema. The patchy ground-glass area in the left upper lobe as well as ground-glass nodular densities at the right lung base are most compatible with pneumonia. Clinical correlation and follow-up resolution recommended. There is no pleural effusion or pneumothorax. The central airways are patent.  The thoracic aorta appear  unremarkable. The the central pulmonary arteries appear patent. There is no cardiomegaly or pericardial effusion. Bilateral hilar and subcarinal adenopathy. The thyroid gland appears unremarkable.  The chest wall soft tissues appear unremarkable. There is degenerative changes of the spine. No acute fracture. A 3 cm partially visualized and incompletely characterized left renal hypodense lesion, likely a cyst.  IMPRESSION: Emphysema. Left upper lobe and right lung base ground-glass nodular density most compatible with pneumonia. Clinical correlation and follow-up recommended.   Electronically Signed   By: Elgie Collard M.D.   On: 06/23/2015 00:39     CBC  Recent Labs Lab 06/22/15 1750 06/23/15 0125  WBC 10.5 8.8  HGB 15.7 13.9  HCT 46.3 41.7  PLT 238 221  MCV 94.5 94.6  MCH 32.0 31.5  MCHC 33.9 33.3  RDW 13.5 13.4  LYMPHSABS 1.5 1.4  MONOABS 1.1* 1.2*  EOSABS 0.1 0.1  BASOSABS 0.0 0.1    Chemistries   Recent Labs Lab 06/22/15 1750  NA 133*  K 3.9  CL 100*  CO2 20*  GLUCOSE 104*  BUN 5*  CREATININE 0.93  CALCIUM 8.7*  AST 23  ALT 15*  ALKPHOS 82  BILITOT 1.3*   ------------------------------------------------------------------------------------------------------------------ estimated creatinine clearance is 96.7 mL/min (by C-G formula based on Cr of 0.93). ------------------------------------------------------------------------------------------------------------------ No results for input(s): HGBA1C in the last 72 hours. ------------------------------------------------------------------------------------------------------------------ No results for input(s): CHOL, HDL, LDLCALC, TRIG, CHOLHDL, LDLDIRECT in the last 72 hours. ------------------------------------------------------------------------------------------------------------------ No results for input(s): TSH, T4TOTAL, T3FREE, THYROIDAB in the last 72 hours.  Invalid input(s):  FREET3 ------------------------------------------------------------------------------------------------------------------ No results for input(s): VITAMINB12, FOLATE, FERRITIN, TIBC, IRON, RETICCTPCT in the last 72 hours.  Coagulation profile No results for input(s): INR, PROTIME in the last 168 hours.  No results for input(s): DDIMER in the last 72 hours.  Cardiac Enzymes  Recent Labs Lab 06/23/15 0125  TROPONINI <0.03   ------------------------------------------------------------------------------------------------------------------ Invalid input(s): POCBNP   CBG: No results for input(s): GLUCAP in the last 168 hours.     Studies: Dg Chest 2 View  06/22/2015   CLINICAL DATA:  Cough, shortness of breath, and chest pain for 1 week. History of COPD. Smoker.  EXAM: CHEST  2 VIEW  COMPARISON:  06/22/2015 at 1638 hr  FINDINGS: Cardiomediastinal silhouette is within normal limits. The lungs remain hyperinflated with evidence of emphysema including bullous changes in the left greater than right apices. Hazy opacity projecting over the lateral left mid lung on the earlier study today persists on the current examination. There is no evidence of new airspace consolidation, overt pulmonary edema, pleural effusion, or pneumothorax. No acute osseous abnormality is identified.  IMPRESSION: Persistent hazy left midlung opacity, unchanged from study earlier today. Again, this could reflect developing pneumonia and follow-up PA and lateral chest radiographs are recommended in 3-4 weeks to ensure resolution.   Electronically Signed   By: Allen  Grady   On: 06/22/2015 20:29   Dg Chest 2 View  06/22/2015   CLINICAL DATA:  Right-sided chest pain radiating to the back, cough and chills, hemoptysis  EXAM: CHEST  2 VIEW  COMPARISON:  10/05/2011  FINDINGS: Diffuse emphysematous change is noted. Ill-defined hazy opacity is identified over the left posterior ninth rib, possibly superimposition of shadows  although pulmonary parenchymal opacity could appear similar. No pleural effusion. No pneumothorax. Heart size is normal.  IMPRESSION: Severe emphysematous change reidentified with new patchy hazy airspace opacity over the left lung base which could be artifactual but could also indicate early pneumonia. Followup PA and lateral chest X-ray is recommended in 3-4 weeks following trial of antibiotic therapy to ensure resolution and exclude underlying malignancy.   Electronically Signed   By: Gretchen  Green M.D.   On: 06/22/2015 16:48   Ct Chest W Contrast  06/23/2015   CLINICAL DATA:  54 year old male with hemoptysis and a cough  EXAM: CT CHEST WITH CONTRAST  TECHNIQUE: Multidetector CT imaging of the chest was performed during intravenous contrast administration.  CONTRAST:  [id number] OMNIPAQUE IOHEXOL 300 MG/ML  SOLN  COMPARISON:  Chest radiograph dated 06/22/2015  FINDINGS: Paraseptal and centrilobular emphysema. The patchy ground-glass area in the left upper lobe as well as ground-glass nodular densities at the right lung base are most compatible with pneumonia. Clinical correlation and follow-up resolution recommended. There is no pleural effusion or pneumothorax. The central airways are patent.  The thoracic aorta appear unremarkable. The the central pulmonary arteries appear patent. There is no cardiomegaly or pericardial effusion. Bilateral hilar and subcarinal adenopathy. The thyroid gland appears unremarkable.  The chest wall  soft tissues appear unremarkable. There is degenerative changes of the spine. No acute fracture. A 3 cm partially visualized and incompletely characterized left renal hypodense lesion, likely a cyst.  IMPRESSION: Emphysema. Left upper lobe and right lung base ground-glass nodular density most compatible with pneumonia. Clinical correlation and follow-up recommended.   Electronically Signed   By: Elgie Collard M.D.   On: 06/23/2015 00:39      No results found for: HGBA1C Lab Results   Component Value Date   CREATININE 0.93 06/22/2015       Scheduled Meds: . enoxaparin (LOVENOX) injection  40 mg Subcutaneous Q24H  . levofloxacin (LEVAQUIN) IV  750 mg Intravenous Q24H  . zolpidem  5 mg Oral QHS   Continuous Infusions: . sodium chloride 125 mL/hr at 06/23/15 0057    Active Problems:   Pneumonia    Time spent: 45 minutes   [facility]  [facility] Pager [phone]. If 7PM-7AM, please contact night-coverage at [email or web address], password [facility] 06/23/2015, 9:22 AM  LOS: 1 day

## 2015-06-23 NOTE — Progress Notes (Signed)
Pt. Arrived from ED with a non productive cough, VSS, and reporting no pain. Will continue to monitor. Suzy Bouchard E, [place] 06/23/2015  [phone]

## 2015-06-23 NOTE — Progress Notes (Signed)
SATURATION QUALIFICATIONS: (This note is used to comply with regulatory documentation for home oxygen)  Patient Saturations on Room Air at Rest = 95%  Patient Saturations on Room Air while Ambulating = 93-98%

## 2015-06-24 ENCOUNTER — Telehealth: Payer: Self-pay | Admitting: *Deleted

## 2015-06-24 ENCOUNTER — Telehealth: Payer: Self-pay

## 2015-06-24 LAB — CBC
HCT: 45.2 % (ref 39.0–52.0)
Hemoglobin: 15.2 g/dL (ref 13.0–17.0)
MCH: 32.1 pg (ref 26.0–34.0)
MCHC: 33.6 g/dL (ref 30.0–36.0)
MCV: 95.4 fL (ref 78.0–100.0)
Platelets: 219 10*3/uL (ref 150–400)
RBC: 4.74 MIL/uL (ref 4.22–5.81)
RDW: 13.3 % (ref 11.5–15.5)
WBC: 7.7 10*3/uL (ref 4.0–10.5)

## 2015-06-24 LAB — COMPREHENSIVE METABOLIC PANEL
ALT: 21 U/L (ref 17–63)
AST: 24 U/L (ref 15–41)
Albumin: 3.3 g/dL — ABNORMAL LOW (ref 3.5–5.0)
Alkaline Phosphatase: 84 U/L (ref 38–126)
Anion gap: 7 (ref 5–15)
BUN: 7 mg/dL (ref 6–20)
CO2: 24 mmol/L (ref 22–32)
Calcium: 9.4 mg/dL (ref 8.9–10.3)
Chloride: 107 mmol/L (ref 101–111)
Creatinine, Ser: 0.89 mg/dL (ref 0.61–1.24)
GFR calc Af Amer: >60 mL/min (ref >60)
GFR calc non Af Amer: >60 mL/min (ref >60)
Glucose, Bld: 99 mg/dL (ref 65–99)
POTASSIUM: 4.2 mmol/L (ref 3.5–5.1)
SODIUM: 138 mmol/L (ref 135–145)
Total Bilirubin: 1.1 mg/dL (ref 0.3–1.2)
Total Protein: 6.6 g/dL (ref 6.5–8.1)

## 2015-06-24 LAB — LEGIONELLA ANTIGEN, URINE

## 2015-06-24 MED ORDER — LEVOFLOXACIN 750 MG PO TABS: 750.0000 mg | ORAL_TABLET | Freq: Every day | ORAL | Status: DC

## 2015-06-24 MED ORDER — GUAIFENESIN-CODEINE 100-10 MG/5ML PO SOLN: 5.0000 mL | Freq: Four times a day (QID) | ORAL | Status: DC | PRN

## 2015-06-24 NOTE — Clinical Social Work Note (Signed)
CSW consult acknowledged:  Clinical Social Worker received a consult indicating patient does not have a primary care physician. CSW to notify RNCM.  Clinical Social Worker will sign off for now as social work intervention is no longer needed. Please consult [place] again if new need arises.  Derenda Fennel, MSW, LCSWA [phone] 06/24/2015 9:34 AM

## 2015-06-24 NOTE — Progress Notes (Signed)
ANTIBIOTIC CONSULT NOTE - INITIAL  Pharmacy Consult for Levofloxacin Indication: pneumonia  Allergies  Allergen Reactions  . Penicillins     unknown    Patient Measurements: Height:  (180.3 cm) Weight: 190 lb (86.183 kg) IBW/kg (Calculated) : 75.3   Vital Signs: Temp: 98.2 F (36.8 C) (07/25 0545) Temp Source: Oral (07/25 0545) BP: 119/80 mmHg (07/25 0545) Pulse Rate: 89 (07/25 0545) Intake/Output from previous day: 07/24 0701 - 07/25 0700 In: 720 [P.O.:720] Out: 600 [Urine:600] Intake/Output from this shift:    Labs:  Recent Labs  06/22/15 1750 06/23/15 0125 06/24/15 0414  WBC 10.5 8.8 7.7  HGB 15.7 13.9 15.2  PLT 238 221 219  CREATININE 0.93  --  0.89   Estimated Creatinine Clearance: 101.1 mL/min (by C-G formula based on Cr of 0.89). No results for input(s): VANCOTROUGH, VANCOPEAK, VANCORANDOM, GENTTROUGH, GENTPEAK, GENTRANDOM, TOBRATROUGH, TOBRAPEAK, TOBRARND, AMIKACINPEAK, AMIKACINTROU, AMIKACIN in the last 72 hours.   Microbiology: Recent Results (from the past 720 hour(s))  Culture, blood (routine x 2)     Status: None (Preliminary result)   Collection Time: 06/22/15 10:20 PM  Result Value Ref Range Status   Specimen Description BLOOD RIGHT ANTECUBITAL  Final   Special Requests BOTTLES DRAWN AEROBIC AND ANAEROBIC 5CC  Final   Culture NO GROWTH < 24 HOURS  Final   Report Status PENDING  Incomplete  Culture, blood (routine x 2)     Status: None (Preliminary result)   Collection Time: 06/22/15 10:24 PM  Result Value Ref Range Status   Specimen Description BLOOD RIGHT HAND  Final   Special Requests BOTTLES DRAWN AEROBIC AND ANAEROBIC 4CC  Final   Culture NO GROWTH < 24 HOURS  Final   Report Status PENDING  Incomplete    Medical History: Past Medical History  Diagnosis Date  . ETOH abuse   . Emphysema   . COPD (chronic obstructive pulmonary disease)   . Pneumonia     Medications:  Scheduled:  . enoxaparin (LOVENOX) injection  40 mg  Subcutaneous Q24H  . levofloxacin (LEVAQUIN) IV  750 mg Intravenous Q24H  . zolpidem  5 mg Oral QHS   Infusions:  . sodium chloride 75 mL/hr at 06/23/15 1027   PRN: albuterol, guaiFENesin-codeine, morphine injection, ondansetron (ZOFRAN) IV Assessment: Alejandro Ferguson admitted community acquired pneumonia and initiated on Levofloxacin. Pt is afebrile, WBC wnl. Currently on day 3 of levofloxacin.   Levoflox 7/23>>  7/23 BC NG x24H 7/23 CXR Severe emphysematous change, new patchy hazy airspace opacity over the left lung base could also indicate early PNA 7/23 Strep Pneumo Urinary Antigen Neg 7/24 HIV Screen Non Reactive  7/24 CT Chest Emphysema.LLL and right lung base ground-glass nodular density most compatible w/ PNA  Plan:  Continue Levofloxacin 750 mg Q24H Monitor for s/sx of worsening infection or worsening renal function.  Pt is stable on current regimen and CrCl >50 ml/min. Pharmacy will sign off.  Please reconsult if further assistance needed.    Synetta Fail, PharmD Pharmacy Resident 06/24/2015,8:58 AM

## 2015-06-24 NOTE — Progress Notes (Addendum)
Placed call to Secondary reviewer RAronson, MD for assist as CM unable to meet pt for inpt. CM spoke with pt who confirms self pay [facility] resident with no pcp.  CM discussed and provided written information for self pay pcps, discussed the importance of pcp vs EDP services for f/u care, www.needymeds.org, www.goodrx.com, discounted pharmacies and other [employer] such as [employer] , [employer], affordable care act,  [place] med assist, financial assistance, self pay dental services, [place] med assist, DSS and  health department  Reviewed resources for [employer] self pay pcps like [facility], family medicine at [facility], [facility], [facility], local urgent care centers, [facility], [facility] family practice, general medical clinics, family services of the [place], [facility] urgent care plus others, medication resources, [facility] out patient pharmacies and housing Pt voiced understanding and appreciation of resources provided   Pt given coupons for Good RX Levaquin  po x 5D     $12.00,

## 2015-06-24 NOTE — Telephone Encounter (Signed)
Made pt aware that because he has Hospital f/u appt at [facility] on Jul 01, 2015 at 3:30pm he can get assistance with Rxs on tomorrow. Pt distracted and stated he did not have a pencil to write everything down. I encouraged him to follow up with them on Tuesday so that he could continue his Antibiotic Course. He has had his dose for today but needs 5 more days. Stated he would go in am.

## 2015-06-24 NOTE — Telephone Encounter (Signed)
Spoke to G. Crutchfield, CM re. Hospital follow up for the patient. CM discussed services provided at the [facility] including financial assistance and walk in hours for orange card/[facility] Discount applications, social work services and pharmacy assistance.  A hospital follow up appointment was scheduled for the patient for 07/01/15 @ 1530 at [facility].   C. Crutchfield stated that she would contact the patient and inform him of this information and appointment time.

## 2015-06-24 NOTE — Discharge Summary (Signed)
Physician Discharge Summary  Alejandro Ferguson MRN: [id number] DOB/AGE: [birth year] 55 y.o.  PCP: No PCP Per Patient   Admit date: 06/22/2015 Discharge date: 06/24/2015  Discharge Diagnoses:     Active Problems: Community-acquired pneumonia Crack lung Polysubstance abuse      Follow-up recommendations Follow-up with PCP in 3-5 days , including although additional recommended appointments as below Follow-up CBC, CMP in 3-5 days      Medication List    TAKE these medications        guaiFENesin-codeine 100-10 MG/5ML syrup  Take 5 mLs by mouth every 6 (six) hours as needed for cough.     levofloxacin 750 MG tablet  Commonly known as:  LEVAQUIN  Take 1 tablet (750 mg total) by mouth daily.         Discharge Condition: Stable    Disposition: 01-Home or Self Care   Consults:  None     Significant Diagnostic Studies:  Dg Chest 2 View  06/22/2015   CLINICAL DATA:  Cough, shortness of breath, and chest pain for 1 week. History of COPD. Smoker.  EXAM: CHEST  2 VIEW  COMPARISON:  06/22/2015 at 1638 hr  FINDINGS: Cardiomediastinal silhouette is within normal limits. The lungs remain hyperinflated with evidence of emphysema including bullous changes in the left greater than right apices. Hazy opacity projecting over the lateral left mid lung on the earlier study today persists on the current examination. There is no evidence of new airspace consolidation, overt pulmonary edema, pleural effusion, or pneumothorax. No acute osseous abnormality is identified.  IMPRESSION: Persistent hazy left midlung opacity, unchanged from study earlier today. Again, this could reflect developing pneumonia and follow-up PA and lateral chest radiographs are recommended in 3-4 weeks to ensure resolution.   Electronically Signed   By: Logan Bores   On: 06/22/2015 20:29   Dg Chest 2 View  06/22/2015   CLINICAL DATA:  Right-sided chest pain radiating to the back, cough and chills, hemoptysis  EXAM:  CHEST  2 VIEW  COMPARISON:  10/05/2011  FINDINGS: Diffuse emphysematous change is noted. Ill-defined hazy opacity is identified over the left posterior ninth rib, possibly superimposition of shadows although pulmonary parenchymal opacity could appear similar. No pleural effusion. No pneumothorax. Heart size is normal.  IMPRESSION: Severe emphysematous change reidentified with new patchy hazy airspace opacity over the left lung base which could be artifactual but could also indicate early pneumonia. Followup PA and lateral chest X-ray is recommended in 3-4 weeks following trial of antibiotic therapy to ensure resolution and exclude underlying malignancy.   Electronically Signed   By: Conchita Paris M.D.   On: 06/22/2015 16:48   Ct Chest W Contrast  06/23/2015   CLINICAL DATA:  54 year old male with hemoptysis and a cough  EXAM: CT CHEST WITH CONTRAST  TECHNIQUE: Multidetector CT imaging of the chest was performed during intravenous contrast administration.  CONTRAST:  56m OMNIPAQUE IOHEXOL 300 MG/ML  SOLN  COMPARISON:  Chest radiograph dated 06/22/2015  FINDINGS: Paraseptal and centrilobular emphysema. The patchy ground-glass area in the left upper lobe as well as ground-glass nodular densities at the right lung base are most compatible with pneumonia. Clinical correlation and follow-up resolution recommended. There is no pleural effusion or pneumothorax. The central airways are patent.  The thoracic aorta appear unremarkable. The the central pulmonary arteries appear patent. There is no cardiomegaly or pericardial effusion. Bilateral hilar and subcarinal adenopathy. The thyroid gland appears unremarkable.  The chest wall soft tissues appear unremarkable. There is  degenerative changes of the spine. No acute fracture. A 3 cm partially visualized and incompletely characterized left renal hypodense lesion, likely a cyst.  IMPRESSION: Emphysema. Left upper lobe and right lung base ground-glass nodular density most  compatible with pneumonia. Clinical correlation and follow-up recommended.   Electronically Signed   By: Anner Crete M.D.   On: 06/23/2015 00:39       Filed Weights   06/22/15 1612 06/23/15 0016  Weight: 83.547 kg (184 lb 3 oz) 86.183 kg (190 lb)     Microbiology: Recent Results (from the past 240 hour(s))  Culture, blood (routine x 2)     Status: None (Preliminary result)   Collection Time: 06/22/15 10:20 PM  Result Value Ref Range Status   Specimen Description BLOOD RIGHT ANTECUBITAL  Final   Special Requests BOTTLES DRAWN AEROBIC AND ANAEROBIC 5CC  Final   Culture NO GROWTH < 24 HOURS  Final   Report Status PENDING  Incomplete  Culture, blood (routine x 2)     Status: None (Preliminary result)   Collection Time: 06/22/15 10:24 PM  Result Value Ref Range Status   Specimen Description BLOOD RIGHT HAND  Final   Special Requests BOTTLES DRAWN AEROBIC AND ANAEROBIC 4CC  Final   Culture NO GROWTH < 24 HOURS  Final   Report Status PENDING  Incomplete       Blood Culture    Component Value Date/Time   SDES BLOOD RIGHT HAND 06/22/2015 2224   SPECREQUEST BOTTLES DRAWN AEROBIC AND ANAEROBIC 4CC 06/22/2015 2224   CULT NO GROWTH < 24 HOURS 06/22/2015 2224   REPTSTATUS PENDING 06/22/2015 2224      Labs: Results for orders placed or performed during the hospital encounter of 06/22/15 (from the past 48 hour(s))  I-Stat Troponin, ED (not at [facility])     Status: None   Collection Time: 06/22/15  5:36 PM  Result Value Ref Range   Troponin i, poc 0.00 0.00 - 0.08 ng/mL   Comment 3            Comment: Due to the release kinetics of cTnI, a negative result within the first hours of the onset of symptoms does not rule out myocardial infarction with certainty. If myocardial infarction is still suspected, repeat the test at appropriate intervals.   CBC with Differential     Status: Abnormal   Collection Time: 06/22/15  5:50 PM  Result Value Ref Range   WBC 10.5 4.0 - 10.5 K/uL    RBC 4.90 4.22 - 5.81 MIL/uL   Hemoglobin 15.7 13.0 - 17.0 g/dL   HCT 46.3 39.0 - 52.0 %   MCV 94.5 78.0 - 100.0 fL   MCH 32.0 26.0 - 34.0 pg   MCHC 33.9 30.0 - 36.0 g/dL   RDW 13.5 11.5 - 15.5 %   Platelets 238 150 - 400 K/uL   Neutrophils Relative % 75 43 - 77 %   Lymphocytes Relative 14 12 - 46 %   Monocytes Relative 10 3 - 12 %   Eosinophils Relative 1 0 - 5 %   Basophils Relative 0 0 - 1 %   Neutro Abs 7.8 (H) 1.7 - 7.7 K/uL   Lymphs Abs 1.5 0.7 - 4.0 K/uL   Monocytes Absolute 1.1 (H) 0.1 - 1.0 K/uL   Eosinophils Absolute 0.1 0.0 - 0.7 K/uL   Basophils Absolute 0.0 0.0 - 0.1 K/uL  Comprehensive metabolic panel     Status: Abnormal   Collection Time: 06/22/15  5:50  PM  Result Value Ref Range   Sodium 133 (L) 135 - 145 mmol/L   Potassium 3.9 3.5 - 5.1 mmol/L   Chloride 100 (L) 101 - 111 mmol/L   CO2 20 (L) 22 - 32 mmol/L   Glucose, Bld 104 (H) 65 - 99 mg/dL   BUN 5 (L) 6 - 20 mg/dL   Creatinine, Ser 0.93 0.61 - 1.24 mg/dL   Calcium 8.7 (L) 8.9 - 10.3 mg/dL   Total Protein 7.0 6.5 - 8.1 g/dL   Albumin 3.7 3.5 - 5.0 g/dL   AST 23 15 - 41 U/L   ALT 15 (L) 17 - 63 U/L   Alkaline Phosphatase 82 38 - 126 U/L   Total Bilirubin 1.3 (H) 0.3 - 1.2 mg/dL   GFR calc non Af Amer >60 >60 mL/min   GFR calc Af Amer >60 >60 mL/min    Comment: (NOTE) The eGFR has been calculated using the CKD EPI equation. This calculation has not been validated in all clinical situations. eGFR's persistently <60 mL/min signify possible Chronic Kidney Disease.    Anion gap 13 5 - 15  Urinalysis, Routine w reflex microscopic (not at [facility])     Status: None   Collection Time: 06/22/15  8:00 PM  Result Value Ref Range   Color, Urine YELLOW YELLOW   APPearance CLEAR CLEAR   Specific Gravity, Urine 1.014 1.005 - 1.030   pH 6.0 5.0 - 8.0   Glucose, UA NEGATIVE NEGATIVE mg/dL   Hgb urine dipstick NEGATIVE NEGATIVE   Bilirubin Urine NEGATIVE NEGATIVE   Ketones, ur NEGATIVE NEGATIVE mg/dL   Protein,  ur NEGATIVE NEGATIVE mg/dL   Urobilinogen, UA 1.0 0.0 - 1.0 mg/dL   Nitrite NEGATIVE NEGATIVE   Leukocytes, UA NEGATIVE NEGATIVE    Comment: MICROSCOPIC NOT DONE ON URINES WITH NEGATIVE PROTEIN, BLOOD, LEUKOCYTES, NITRITE, OR GLUCOSE <1000 mg/dL.  Urine rapid drug screen (hosp performed)     Status: Abnormal   Collection Time: 06/22/15  8:00 PM  Result Value Ref Range   Opiates NONE DETECTED NONE DETECTED   Cocaine NONE DETECTED NONE DETECTED   Benzodiazepines NONE DETECTED NONE DETECTED   Amphetamines NONE DETECTED NONE DETECTED   Tetrahydrocannabinol POSITIVE (A) NONE DETECTED   Barbiturates NONE DETECTED NONE DETECTED    Comment:        DRUG SCREEN FOR MEDICAL PURPOSES ONLY.  IF CONFIRMATION IS NEEDED FOR ANY PURPOSE, NOTIFY LAB WITHIN 5 DAYS.        LOWEST DETECTABLE LIMITS FOR URINE DRUG SCREEN Drug Class       Cutoff (ng/mL) Amphetamine      1000 Barbiturate      200 Benzodiazepine   384 Tricyclics       536 Opiates          300 Cocaine          300 THC              50   Strep pneumoniae urinary antigen     Status: None   Collection Time: 06/22/15  8:00 PM  Result Value Ref Range   Strep Pneumo Urinary Antigen NEGATIVE NEGATIVE    Comment:        Infection due to S. pneumoniae cannot be absolutely ruled out since the antigen present may be below the detection limit of the test.   Culture, blood (routine x 2)     Status: None (Preliminary result)   Collection Time: 06/22/15 10:20 PM  Result Value Ref  Range   Specimen Description BLOOD RIGHT ANTECUBITAL    Special Requests BOTTLES DRAWN AEROBIC AND ANAEROBIC 5CC    Culture NO GROWTH < 24 HOURS    Report Status PENDING   Culture, blood (routine x 2)     Status: None (Preliminary result)   Collection Time: 06/22/15 10:24 PM  Result Value Ref Range   Specimen Description BLOOD RIGHT HAND    Special Requests BOTTLES DRAWN AEROBIC AND ANAEROBIC 4CC    Culture NO GROWTH < 24 HOURS    Report Status PENDING    I-Stat CG4 Lactic Acid, ED     Status: None   Collection Time: 06/22/15 10:38 PM  Result Value Ref Range   Lactic Acid, Venous 0.78 0.5 - 2.0 mmol/L  HIV antibody     Status: None   Collection Time: 06/23/15  1:25 AM  Result Value Ref Range   HIV Screen 4th Generation wRfx Non Reactive Non Reactive    Comment: (NOTE) Performed At: [facility] [address] [place], [place] [id number] Lindon Romp MD FW:[id number]   CBC WITH DIFFERENTIAL     Status: Abnormal   Collection Time: 06/23/15  1:25 AM  Result Value Ref Range   WBC 8.8 4.0 - 10.5 K/uL   RBC 4.41 4.22 - 5.81 MIL/uL   Hemoglobin 13.9 13.0 - 17.0 g/dL   HCT 41.7 39.0 - 52.0 %   MCV 94.6 78.0 - 100.0 fL   MCH 31.5 26.0 - 34.0 pg   MCHC 33.3 30.0 - 36.0 g/dL   RDW 13.4 11.5 - 15.5 %   Platelets 221 150 - 400 K/uL   Neutrophils Relative % 68 43 - 77 %   Neutro Abs 6.1 1.7 - 7.7 K/uL   Lymphocytes Relative 16 12 - 46 %   Lymphs Abs 1.4 0.7 - 4.0 K/uL   Monocytes Relative 14 (H) 3 - 12 %   Monocytes Absolute 1.2 (H) 0.1 - 1.0 K/uL   Eosinophils Relative 1 0 - 5 %   Eosinophils Absolute 0.1 0.0 - 0.7 K/uL   Basophils Relative 1 0 - 1 %   Basophils Absolute 0.1 0.0 - 0.1 K/uL  Troponin I     Status: None   Collection Time: 06/23/15  1:25 AM  Result Value Ref Range   Troponin I <0.03 <0.031 ng/mL    Comment:        NO INDICATION OF MYOCARDIAL INJURY.   CBC     Status: None   Collection Time: 06/24/15  4:14 AM  Result Value Ref Range   WBC 7.7 4.0 - 10.5 K/uL   RBC 4.74 4.22 - 5.81 MIL/uL   Hemoglobin 15.2 13.0 - 17.0 g/dL   HCT 45.2 39.0 - 52.0 %   MCV 95.4 78.0 - 100.0 fL   MCH 32.1 26.0 - 34.0 pg   MCHC 33.6 30.0 - 36.0 g/dL   RDW 13.3 11.5 - 15.5 %   Platelets 219 150 - 400 K/uL  Comprehensive metabolic panel     Status: Abnormal   Collection Time: 06/24/15  4:14 AM  Result Value Ref Range   Sodium 138 135 - 145 mmol/L   Potassium 4.2 3.5 - 5.1 mmol/L   Chloride 107 101 - 111 mmol/L   CO2  24 22 - 32 mmol/L   Glucose, Bld 99 65 - 99 mg/dL   BUN 7 6 - 20 mg/dL   Creatinine, Ser 0.89 0.61 - 1.24 mg/dL   Calcium 9.4 8.9 -  10.3 mg/dL   Total Protein 6.6 6.5 - 8.1 g/dL   Albumin 3.3 (L) 3.5 - 5.0 g/dL   AST 24 15 - 41 U/L   ALT 21 17 - 63 U/L   Alkaline Phosphatase 84 38 - 126 U/L   Total Bilirubin 1.1 0.3 - 1.2 mg/dL   GFR calc non Af Amer >60 >60 mL/min   GFR calc Af Amer >60 >60 mL/min    Comment: (NOTE) The eGFR has been calculated using the CKD EPI equation. This calculation has not been validated in all clinical situations. eGFR's persistently <60 mL/min signify possible Chronic Kidney Disease.    Anion gap 7 5 - 15     Lipid Panel  No results found for: CHOL, TRIG, HDL, CHOLHDL, VLDL, LDLCALC, LDLDIRECT   No results found for: HGBA1C   Lab Results  Component Value Date   CREATININE 0.89 06/24/2015     HPI :Alejandro Ferguson is a 54 y.o. male with history of polysubstance abuse who came with cc of cough and dyspnea for the past week. He said his cough was productive of clear phlegm initially but he had hemoptysis over the last two days. He had fever and chills. He had right side chest pain induced by cough. He denied N/V/D/C/abd pain/dyuria/dizziness. He had loss of appetite. He was smoking weed and cocaine 2 weeks ago. He smokes 1-2 PPD and has been smoking for the past 30 years.   HOSPITAL COURSE:   Community acquired pneumonia/ crack lung Patient was treated with IV Levaquin, subsequently switched to by mouth for another 7 days, oxygen requirements stable on room air Blood culture, sputum culture negative thus far HIV antibody nonreactive, urine strep pneumo negative, urine Legionella antigen still pending Patient is doing well and is being discharged home today   COPDWithout exacerbation: Continue when necessary nebulizer treatments   Hemoptysis : re solved  likely due to CAP vs  Crack lung  Well score 2.5 : low vs. Moderate risk CT scan shows  left upper and right lower lobe pneumonia, no PE  Polysubstance abuse Counseling done   Discharge Exam: *   Blood pressure 119/80, pulse 89, temperature 98.2 F (36.8 C), temperature source Oral, resp. rate 20, height '5\' 11"'  (1.803 m), weight 86.183 kg (190 lb), SpO2 93 %.  General: No acute respiratory distress Lungs: Clear to auscultation bilaterally without wheezes or crackles Cardiovascular: Regular rate and rhythm without murmur gallop or rub normal S1 and S2 Abdomen: Nontender, nondistended, soft, bowel sounds positive, no rebound, no ascites, no appreciable mass Extremities: No significant cyanosis, clubbing, or edema bilateral lower extremities       Discharge Instructions    Diet - low sodium heart healthy    Complete by:  As directed      Increase activity slowly    Complete by:  As directed              Signed: Meryn Sarracino 06/24/2015, 9:24 AM        Time spent >45 mins

## 2015-06-24 NOTE — Progress Notes (Signed)
Discharge instruction reviewed with patient. RXs given. All questions answered at this time. Will continue to monitor.  Sim Boast, RN

## 2015-06-27 LAB — CULTURE, BLOOD (ROUTINE X 2)
Culture: NO GROWTH
Culture: NO GROWTH

## 2015-07-01 ENCOUNTER — Encounter: Payer: Self-pay | Admitting: Family Medicine

## 2015-07-01 ENCOUNTER — Ambulatory Visit: Payer: Self-pay | Attending: Family Medicine | Admitting: Family Medicine

## 2015-07-01 VITALS — BP 125/87 | HR 75 | Temp 97.8°F | Ht 71.0 in | Wt 182.6 lb

## 2015-07-01 DIAGNOSIS — M5441 Lumbago with sciatica, right side: Secondary | ICD-10-CM

## 2015-07-01 DIAGNOSIS — J181 Lobar pneumonia, unspecified organism: Secondary | ICD-10-CM

## 2015-07-01 DIAGNOSIS — F17210 Nicotine dependence, cigarettes, uncomplicated: Secondary | ICD-10-CM | POA: Insufficient documentation

## 2015-07-01 DIAGNOSIS — J449 Chronic obstructive pulmonary disease, unspecified: Secondary | ICD-10-CM | POA: Insufficient documentation

## 2015-07-01 DIAGNOSIS — Z79899 Other long term (current) drug therapy: Secondary | ICD-10-CM | POA: Insufficient documentation

## 2015-07-01 DIAGNOSIS — M5440 Lumbago with sciatica, unspecified side: Secondary | ICD-10-CM | POA: Insufficient documentation

## 2015-07-01 DIAGNOSIS — J189 Pneumonia, unspecified organism: Secondary | ICD-10-CM | POA: Insufficient documentation

## 2015-07-01 DIAGNOSIS — Z720 Tobacco use: Secondary | ICD-10-CM

## 2015-07-01 MED ORDER — NAPROXEN 500 MG PO TABS: 500.0000 mg | ORAL_TABLET | Freq: Two times a day (BID) | ORAL | Status: DC

## 2015-07-01 MED ORDER — KETOROLAC TROMETHAMINE 60 MG/2ML IM SOLN
60.0000 mg | Freq: Once | INTRAMUSCULAR | Status: AC
  Administered 2015-07-01: 60 mg via INTRAMUSCULAR

## 2015-07-01 MED ORDER — CYCLOBENZAPRINE HCL 10 MG PO TABS: 10.0000 mg | ORAL_TABLET | Freq: Two times a day (BID) | ORAL | Status: DC | PRN

## 2015-07-01 NOTE — Progress Notes (Signed)
Subjective:    Patient ID: Alejandro Ferguson, male    DOB: December 17, 1960, 54 y.o.   MRN: [id number]  Admit date: 06/22/15 Discharge date: 06/24/15   HPI   Alejandro Ferguson is a 54 year old male with a history of polysubstance abuse recently hospitalized for community-acquired pneumonia that he had presented to [facility] ED with cough, hemoptysis and dyspnea, fever and chills and associated right-sided chest pain. He had a chest x-ray which revealed severe emphysematous change with new patchy hazy airspace opacity over the left lung base which could be artifactual but could also indicate early pneumonia. Chest CT revealed emphysema, left upper lobe and right lung base groundglass nodular density most compatible with pneumonia. He was commenced on IV Levaquin; blood culture, sputum culture, urine Legionella HIV antibody were all negative. He symptoms improved and he was counseled against polysubstance abuse; oxygen requirement was stable on room air. He was switched from IV Levaquin to oral Levaquin for 7 more days and discharge with a diagnosis of community acquired pneumonia versus crack lung.  Interval history: He has no respiratory symptoms at this time and has completed his course of antibiotic; he complains of right-sided back pain for the last two days ever since he helped a lady moved a refrigerator. Pain is 10/10 and radiates down the back of his right lower extremity; he sometimes feel numbness in his right foot but denies loss of sphincteric function or weakness in his leg.  Past Medical History  Diagnosis Date  . ETOH abuse   . Emphysema   . COPD (chronic obstructive pulmonary disease)   . Pneumonia     Past Surgical History  Procedure Laterality Date  . Cervical disc surgery    . Left shoulder surgery    . Appendectomy      History   Social History  . Marital Status: Married    Spouse Name: N/A  . Number of Children: N/A  . Years of Education: N/A   Occupational History  . Not  on file.   Social History Main Topics  . Smoking status: Current Every Day Smoker -- 0.25 packs/day for 25 years    Types: Cigarettes    Last Attempt to Quit: 07/01/2011  . Smokeless tobacco: Never Used  . Alcohol Use: 18.0 oz/week    30 Cans of beer per week     Comment: 6 beers a day ON 8/1 PATIENT STATES HE DOESNT DRINK  . Drug Use: No     Comment: 3 weeks ago used pot  . Sexual Activity: Not on file   Other Topics Concern  . Not on file   Social History Narrative  . Allergies  Allergen Reactions  . Penicillins     unknown   Current Outpatient Prescriptions on File Prior to Visit  Medication Sig Dispense Refill  . levofloxacin (LEVAQUIN) 750 MG tablet Take 1 tablet (750 mg total) by mouth daily. 7 tablet 0  . guaiFENesin-codeine 100-10 MG/5ML syrup Take 5 mLs by mouth every 6 (six) hours as needed for cough. (Patient not taking: Reported on 07/01/2015) 120 mL 0  . [DISCONTINUED] albuterol (PROVENTIL HFA;VENTOLIN HFA) 108 (90 BASE) MCG/ACT inhaler Inhale 2 puffs into the lungs every 4 (four) hours as needed for wheezing. 1 Inhaler 0  . [DISCONTINUED] albuterol-ipratropium (COMBIVENT) 18-103 MCG/ACT inhaler Inhale 2 puffs into the lungs every 6 (six) hours as needed. Shortness of breath      No current facility-administered medications on file prior to visit.   :.  .  Review of Systems  Constitutional: Negative for activity change and appetite change.  HENT: Negative for sinus pressure and sore throat.   Eyes: Negative for visual disturbance.  Respiratory: Negative for cough, chest tightness and shortness of breath.   Cardiovascular: Negative for chest pain and leg swelling.  Gastrointestinal: Negative for abdominal pain, diarrhea, constipation and abdominal distention.  Endocrine: Negative.   Genitourinary: Negative for dysuria.  Musculoskeletal: Positive for back pain. Negative for myalgias and joint swelling.  Skin: Negative for rash.  Allergic/Immunologic: Negative.    Neurological: Positive for numbness. Negative for weakness and light-headedness.  Psychiatric/Behavioral: Negative for suicidal ideas and dysphoric mood.        Objective:  Filed Vitals:   07/01/15 1535  BP: 125/87  Pulse: 75  Temp: 97.8 F (36.6 C)  Height:  (1.803 m)  Weight: 182 lb 9.6 oz (82.827 kg)  SpO2: 96%       Physical Exam  Constitutional: He is oriented to person, place, and time. He appears well-developed and well-nourished.  HENT:  Head: Normocephalic and atraumatic.  Right Ear: External ear normal.  Left Ear: External ear normal.  Eyes: Conjunctivae and EOM are normal. Pupils are equal, round, and reactive to light.  Neck: Normal range of motion. Neck supple. No tracheal deviation present.  Cardiovascular: Normal rate, regular rhythm and normal heart sounds.   No murmur heard. Pulmonary/Chest: Effort normal and breath sounds normal. No respiratory distress. He has no wheezes. He exhibits no tenderness.  Abdominal: Soft. Bowel sounds are normal. He exhibits no mass. There is no tenderness.  Musculoskeletal: He exhibits tenderness (Tenderness on right side of back and on lying down, positive straight leg raise on the right; left is normal.).  Neurological: He is alert and oriented to person, place, and time.  Skin: Skin is warm and dry.  Psychiatric: He has a normal mood and affect.      CLINICAL DATA: 54 year old male with hemoptysis and a cough  EXAM: CT CHEST WITH CONTRAST  TECHNIQUE: Multidetector CT imaging of the chest was performed during intravenous contrast administration.  CONTRAST: 75mL OMNIPAQUE IOHEXOL 300 MG/ML SOLN  COMPARISON: Chest radiograph dated 06/22/2015  FINDINGS: Paraseptal and centrilobular emphysema. The patchy ground-glass area in the left upper lobe as well as ground-glass nodular densities at the right lung base are most compatible with pneumonia. Clinical correlation and follow-up resolution  recommended. There is no pleural effusion or pneumothorax. The central airways are patent.  The thoracic aorta appear unremarkable. The the central pulmonary arteries appear patent. There is no cardiomegaly or pericardial effusion. Bilateral hilar and subcarinal adenopathy. The thyroid gland appears unremarkable.  The chest wall soft tissues appear unremarkable. There is degenerative changes of the spine. No acute fracture. A 3 cm partially visualized and incompletely characterized left renal hypodense lesion, likely a cyst.  IMPRESSION: Emphysema. Left upper lobe and right lung base ground-glass nodular density most compatible with pneumonia. Clinical correlation and follow-up recommended.   Electronically Signed  By: Elgie Collard M.D.  On: 06/23/2015 00:39       Assessment & Plan:  54 year old male with a history of polysubstance abuse recently hospitalized for community-acquired pneumonia who comes in here for follow-up visit with right-sided lower back pain with sciatica secondary to back injury.  Community-acquired pneumonia: Resolved.  Low back pain with sciatica: Secondary to trauma. IM Toradol given, advised to apply warm compress. Placed on a muscle relaxant and Naprosyn. Urine drug screen sent off given history of polysubstance abuse.  Tobacco abuse: He has cut back significantly and would like to work on this on his own. Spent 3 minutes counseling on cessation.  This note has been created with Education officer, environmental. Any transcriptional errors are unintentional.

## 2015-07-01 NOTE — Progress Notes (Signed)
PNA follow up Patient not complaining of pneumonia but of severe 10/10 back pain that starts in lower back and radiates down his leg He helped someone load a refrigerator that had fallen into the road back into the bed of the pick up truck on Saturday Chart says patient smokes 2.5 ppd cigarettes but patient states he smokes 3 cigarettes per day since being dc'd Patient chart says he drinks ETOH but patient says he does not drink

## 2015-07-01 NOTE — Patient Instructions (Signed)
Back Pain, Adult Low back pain is very common. About 1 in 5 people have back pain.The cause of low back pain is rarely dangerous. The pain often gets better over time.About half of people with a sudden onset of back pain feel better in just 2 weeks. About 8 in 10 people feel better by 6 weeks.  CAUSES Some common causes of back pain include:  Strain of the muscles or ligaments supporting the spine.  Wear and tear (degeneration) of the spinal discs.  Arthritis.  Direct injury to the back. DIAGNOSIS Most of the time, the direct cause of low back pain is not known.However, back pain can be treated effectively even when the exact cause of the pain is unknown.Answering your caregiver's questions about your overall health and symptoms is one of the most accurate ways to make sure the cause of your pain is not dangerous. If your caregiver needs more information, he or she may order lab work or imaging tests (X-rays or MRIs).However, even if imaging tests show changes in your back, this usually does not require surgery. HOME CARE INSTRUCTIONS For many people, back pain returns.Since low back pain is rarely dangerous, it is often a condition that people can learn to manageon their own.   Remain active. It is stressful on the back to sit or stand in one place. Do not sit, drive, or stand in one place for more than 30 minutes at a time. Take short walks on level surfaces as soon as pain allows.Try to increase the length of time you walk each day.  Do not stay in bed.Resting more than 1 or 2 days can delay your recovery.  Do not avoid exercise or work.Your body is made to move.It is not dangerous to be active, even though your back may hurt.Your back will likely heal faster if you return to being active before your pain is gone.  Pay attention to your body when you bend and lift. Many people have less discomfortwhen lifting if they bend their knees, keep the load close to their bodies,and  avoid twisting. Often, the most comfortable positions are those that put less stress on your recovering back.  Find a comfortable position to sleep. Use a firm mattress and lie on your side with your knees slightly bent. If you lie on your back, put a pillow under your knees.  Only take over-the-counter or prescription medicines as directed by your caregiver. Over-the-counter medicines to reduce pain and inflammation are often the most helpful.Your caregiver may prescribe muscle relaxant drugs.These medicines help dull your pain so you can more quickly return to your normal activities and healthy exercise.  Put ice on the injured area.  Put ice in a plastic bag.  Place a towel between your skin and the bag.  Leave the ice on for 15-20 minutes, 03-04 times a day for the first 2 to 3 days. After that, ice and heat may be alternated to reduce pain and spasms.  Ask your caregiver about trying back exercises and gentle massage. This may be of some benefit.  Avoid feeling anxious or stressed.Stress increases muscle tension and can worsen back pain.It is important to recognize when you are anxious or stressed and learn ways to manage it.Exercise is a great option. SEEK MEDICAL CARE IF:  You have pain that is not relieved with rest or medicine.  You have pain that does not improve in 1 week.  You have new symptoms.  You are generally not feeling well. SEEK   IMMEDIATE MEDICAL CARE IF:   You have pain that radiates from your back into your legs.  You develop new bowel or bladder control problems.  You have unusual weakness or numbness in your arms or legs.  You develop nausea or vomiting.  You develop abdominal pain.  You feel faint. Document Released: 11/16/2005 Document Revised: 05/17/2012 Document Reviewed: 03/20/2014 Exit[employer] Patient Information 2015 [employer]. This information is not intended to replace advice given to you by your health care provider. Make sure you  discuss any questions you have with your health care provider.  

## 2015-07-02 LAB — DRUG SCREEN PANEL (SERUM)

## 2015-07-08 ENCOUNTER — Ambulatory Visit: Payer: Self-pay | Admitting: Family Medicine

## 2015-07-13 NOTE — Telephone Encounter (Signed)
Pt did make it to his appt at [facility] on August 1. For f/u.

## 2016-05-13 ENCOUNTER — Emergency Department (HOSPITAL_COMMUNITY)
Admission: EM | Admit: 2016-05-13 | Discharge: 2016-05-13 | Disposition: A | Discharge status: Home / Self Care | Payer: Self-pay | Attending: Emergency Medicine | Admitting: Emergency Medicine

## 2016-05-13 ENCOUNTER — Emergency Department (HOSPITAL_COMMUNITY): Payer: Self-pay

## 2016-05-13 ENCOUNTER — Encounter (HOSPITAL_COMMUNITY): Payer: Self-pay | Admitting: Emergency Medicine

## 2016-05-13 DIAGNOSIS — F17210 Nicotine dependence, cigarettes, uncomplicated: Secondary | ICD-10-CM | POA: Insufficient documentation

## 2016-05-13 DIAGNOSIS — R042 Hemoptysis: Secondary | ICD-10-CM | POA: Insufficient documentation

## 2016-05-13 DIAGNOSIS — J209 Acute bronchitis, unspecified: Secondary | ICD-10-CM | POA: Insufficient documentation

## 2016-05-13 DIAGNOSIS — R0789 Other chest pain: Secondary | ICD-10-CM

## 2016-05-13 DIAGNOSIS — J441 Chronic obstructive pulmonary disease with (acute) exacerbation: Secondary | ICD-10-CM | POA: Insufficient documentation

## 2016-05-13 DIAGNOSIS — K4090 Unilateral inguinal hernia, without obstruction or gangrene, not specified as recurrent: Secondary | ICD-10-CM | POA: Insufficient documentation

## 2016-05-13 DIAGNOSIS — Z720 Tobacco use: Secondary | ICD-10-CM

## 2016-05-13 LAB — PROTIME-INR
INR: 1.01 (ref 0.00–1.49)
PROTHROMBIN TIME: 13.1 s (ref 11.6–15.2)

## 2016-05-13 LAB — BASIC METABOLIC PANEL
ANION GAP: 7 (ref 5–15)
BUN: 10 mg/dL (ref 6–20)
CALCIUM: 9.2 mg/dL (ref 8.9–10.3)
CO2: 26 mmol/L (ref 22–32)
Chloride: 107 mmol/L (ref 101–111)
Creatinine, Ser: 0.88 mg/dL (ref 0.61–1.24)
GLUCOSE: 101 mg/dL — AB (ref 65–99)
POTASSIUM: 3.9 mmol/L (ref 3.5–5.1)
SODIUM: 140 mmol/L (ref 135–145)

## 2016-05-13 LAB — CBC
HEMATOCRIT: 46.8 % (ref 39.0–52.0)
HEMOGLOBIN: 16.5 g/dL (ref 13.0–17.0)
MCH: 32.5 pg (ref 26.0–34.0)
MCHC: 35.3 g/dL (ref 30.0–36.0)
MCV: 92.3 fL (ref 78.0–100.0)
Platelets: 277 10*3/uL (ref 150–400)
RBC: 5.07 MIL/uL (ref 4.22–5.81)
RDW: 13.6 % (ref 11.5–15.5)
WBC: 9.4 10*3/uL (ref 4.0–10.5)

## 2016-05-13 LAB — I-STAT TROPONIN, ED: TROPONIN I, POC: 0.01 ng/mL (ref 0.00–0.08)

## 2016-05-13 LAB — ETHANOL: Alcohol, Ethyl (B): <5 mg/dL (ref <5)

## 2016-05-13 LAB — TROPONIN I

## 2016-05-13 MED ORDER — ALBUTEROL SULFATE HFA 108 (90 BASE) MCG/ACT IN AERS
2.0000 | INHALATION_SPRAY | Freq: Once | RESPIRATORY_TRACT | Status: AC
  Administered 2016-05-13: 2 via RESPIRATORY_TRACT
  Filled 2016-05-13: qty 6.7

## 2016-05-13 MED ORDER — AZITHROMYCIN 250 MG PO TABS: ORAL_TABLET | ORAL | Status: DC

## 2016-05-13 MED ORDER — IOPAMIDOL (ISOVUE-370) INJECTION 76%
100.0000 mL | Freq: Once | INTRAVENOUS | Status: AC | PRN
Start: 2016-05-13 — End: 2016-05-13
  Administered 2016-05-13: 100 mL via INTRAVENOUS

## 2016-05-13 MED ORDER — SODIUM CHLORIDE 0.9 % IV BOLUS (SEPSIS)
1000.0000 mL | Freq: Once | INTRAVENOUS | Status: AC
  Administered 2016-05-13: 1000 mL via INTRAVENOUS

## 2016-05-13 MED ORDER — IPRATROPIUM-ALBUTEROL 0.5-2.5 (3) MG/3ML IN SOLN
3.0000 mL | Freq: Once | RESPIRATORY_TRACT | Status: AC
  Administered 2016-05-13: 3 mL via RESPIRATORY_TRACT
  Filled 2016-05-13: qty 3

## 2016-05-13 MED ORDER — PREDNISONE 10 MG (21) PO TBPK: 10.0000 mg | ORAL_TABLET | Freq: Every day | ORAL | Status: DC

## 2016-05-13 MED ORDER — OXYCODONE-ACETAMINOPHEN 5-325 MG PO TABS: 1.0000 | ORAL_TABLET | ORAL | Status: DC | PRN

## 2016-05-13 MED ORDER — SODIUM CHLORIDE 0.9 % IV SOLN
INTRAVENOUS | Status: DC
  Administered 2016-05-13: 18:00:00 via INTRAVENOUS

## 2016-05-13 MED ORDER — AEROCHAMBER PLUS FLO-VU MEDIUM MISC
1.0000 | Freq: Once | Status: AC
  Administered 2016-05-13: 1
  Filled 2016-05-13: qty 1

## 2016-05-13 MED ORDER — METHYLPREDNISOLONE SODIUM SUCC 125 MG IJ SOLR
125.0000 mg | Freq: Once | INTRAMUSCULAR | Status: AC
  Administered 2016-05-13: 125 mg via INTRAVENOUS
  Filled 2016-05-13: qty 2

## 2016-05-13 MED ORDER — AZITHROMYCIN 250 MG PO TABS
500.0000 mg | ORAL_TABLET | Freq: Once | ORAL | Status: AC
  Administered 2016-05-13: 500 mg via ORAL
  Filled 2016-05-13: qty 2

## 2016-05-13 MED ORDER — HYDROMORPHONE HCL 1 MG/ML IJ SOLN
1.0000 mg | Freq: Once | INTRAMUSCULAR | Status: AC
  Administered 2016-05-13: 1 mg via INTRAVENOUS
  Filled 2016-05-13: qty 1

## 2016-05-13 MED ORDER — ONDANSETRON HCL 4 MG/2ML IJ SOLN
4.0000 mg | Freq: Once | INTRAMUSCULAR | Status: AC
  Administered 2016-05-13: 4 mg via INTRAVENOUS
  Filled 2016-05-13: qty 2

## 2016-05-13 MED ORDER — MORPHINE SULFATE (PF) 4 MG/ML IV SOLN
4.0000 mg | Freq: Once | INTRAVENOUS | Status: AC
  Administered 2016-05-13: 4 mg via INTRAVENOUS
  Filled 2016-05-13: qty 1

## 2016-05-13 NOTE — ED Notes (Signed)
Pt c/o pain, MD notified.

## 2016-05-13 NOTE — ED Notes (Signed)
Made 1st request for urine sample,pt unable to provide one at this time. 

## 2016-05-13 NOTE — ED Notes (Signed)
Patient here from home with complaints of chest pain described as heaviness. Reports coughing up blood this morning. Pain 10/10.

## 2016-05-13 NOTE — ED Provider Notes (Signed)
CSN: [id number]     Arrival date & time 05/13/16  1501 History   First MD Initiated Contact with Patient 05/13/16 1608     Chief Complaint  Patient presents with  . Chest Pain  . Hemoptysis   PT IS A 54 YO MALE WHO REPORTS CP AND SOB FOR ABOUT 1 WEEK.  HE STARTING COUGHING UP BLOOD THIS MORNING.  CP IS WORSE WITH MVMT.  PT DENIES ANY TRAUMA.  HE HAS USED INHALERS IN THE PAST, BUT DOES NOT HAVE AN INHALER NOW.  PT ALSO HAS A LEFT INGUINAL HERNIA THAT HE WANTS ME TO TAKE A LOOK AT.  (Consider location/radiation/quality/duration/timing/severity/associated sxs/prior Treatment) Patient is a 55 y.o. male presenting with chest pain. The history is provided by the patient and the spouse.  Chest Pain Pain location:  L chest and R chest Pain quality: sharp   Pain radiates to:  Does not radiate Pain radiates to the back: no   Pain severity:  Moderate Timing:  Constant Progression:  Worsening Chronicity:  New Context: breathing and movement   Relieved by:  Nothing Worsened by:  Movement and coughing Associated symptoms: shortness of breath     Past Medical History  Diagnosis Date  . ETOH abuse   . Emphysema   . COPD (chronic obstructive pulmonary disease) (HCC)   . Pneumonia    Past Surgical History  Procedure Laterality Date  . Cervical disc surgery    . Left shoulder surgery    . Appendectomy     Family History  Problem Relation Age of Onset  . Hypertension Mother   . Lung cancer     Social History  Substance Use Topics  . Smoking status: Current Every Day Smoker -- 0.25 packs/day for 25 years    Types: Cigarettes    Last Attempt to Quit: 07/01/2011  . Smokeless tobacco: Never Used  . Alcohol Use: 18.0 oz/week    30 Cans of beer per week     Comment: 6 beers a day ON 8/1 PATIENT STATES HE DOESNT DRINK    Review of Systems  Respiratory: Positive for shortness of breath.        HEMOPTYSIS  Cardiovascular: Positive for chest pain.  All other systems reviewed and are  negative.     Allergies  Penicillins  Home Medications   Prior to Admission medications   Medication Sig Start Date End Date Taking? Authorizing Provider  azithromycin (ZITHROMAX) 250 MG tablet TAKE AS DIRECTED 05/13/16   Jacalyn Lefevre, MD  oxyCODONE-acetaminophen (PERCOCET/ROXICET) 5-325 MG tablet Take 1 tablet by mouth every 4 (four) hours as needed for severe pain. 05/13/16   Jacalyn Lefevre, MD  predniSONE (STERAPRED UNI-PAK 21 TAB) 10 MG (21) TBPK tablet Take 1 tablet (10 mg total) by mouth daily. Take 6 tabs by mouth daily  for 2 days, then 5 tabs for 2 days, then 4 tabs for 2 days, then 3 tabs for 2 days, 2 tabs for 2 days, then 1 tab by mouth daily for 2 days 05/13/16   Jacalyn Lefevre, MD   BP 136/97 mmHg  Pulse 75  Temp(Src) 98.9 F (37.2 C) (Oral)  Resp 18  Ht 6' (1.829 m)  Wt 190 lb (86.183 kg)  BMI 25.76 kg/m2  SpO2 90% Physical Exam  Constitutional: He is oriented to person, place, and time. He appears well-developed and well-nourished.  HENT:  Head: Normocephalic and atraumatic.  Right Ear: External ear normal.  Left Ear: External ear normal.  Nose: Nose normal.  Mouth/Throat: Oropharynx is clear and moist.  Eyes: Conjunctivae and EOM are normal. Pupils are equal, round, and reactive to light.  Neck: Normal range of motion. Neck supple.  Cardiovascular: Normal rate, regular rhythm, normal heart sounds and intact distal pulses.   Pulmonary/Chest: He has wheezes. He exhibits tenderness.  Abdominal: Soft. Bowel sounds are normal. A hernia is present. Hernia confirmed positive in the left inguinal area.  HERNIA IS EASILY REDUCIBLE.  Musculoskeletal: Normal range of motion.  Neurological: He is alert and oriented to person, place, and time.  Skin: Skin is warm and dry.  Psychiatric: He has a normal mood and affect. His behavior is normal. Judgment and thought content normal.  Nursing note and vitals reviewed.   ED Course  Procedures (including critical care  time) Labs Review Labs Reviewed  BASIC METABOLIC PANEL - Abnormal; Notable for the following:    Glucose, Bld 101 (*)    All other components within normal limits  CBC  PROTIME-INR  TROPONIN I  ETHANOL  TROPONIN I  TROPONIN I  URINALYSIS, ROUTINE W REFLEX MICROSCOPIC (NOT AT [facility])  URINE RAPID DRUG SCREEN, HOSP PERFORMED  I-STAT TROPOININ, ED    Imaging Review Dg Chest 2 View  05/13/2016  CLINICAL DATA:  Chest pain with hemoptysis for several days EXAM: CHEST  2 VIEW COMPARISON:  June 22, 2015 chest radiograph and chest CT June 22, 2015 FINDINGS: There is underlying bullous emphysematous change, more severe on the left than on the right. There is interstitial prominence in portions of both mid and lower lung zones, in part likely due to redistribution of blood flow to viable segments of lung. No well-defined edema or consolidation evident. Heart size is normal. Pulmonary vascularity reflects the underlying bullous emphysematous change. No adenopathy. No bone lesions. IMPRESSION: Underlying bullous emphysematous change. No edema or consolidation. No adenopathy evident. If hemoptysis persists, correlation with chest CT would be advisable to further assess. Electronically Signed   By: Bretta BangWilliam  Woodruff III M.D.   On: 05/13/2016 15:44   Ct Angio Chest W/cm &/or Wo Cm  05/13/2016  CLINICAL DATA:  55 year old with chest pain.  Coughing up blood. EXAM: CT ANGIOGRAPHY CHEST WITH CONTRAST TECHNIQUE: Multidetector CT imaging of the chest was performed using the standard protocol during bolus administration of intravenous contrast. Multiplanar CT image reconstructions and MIPs were obtained to evaluate the vascular anatomy. CONTRAST:  100 mL Isovue 370 COMPARISON:  Chest CT 06/22/2015 and chest CT 07/21/2011 FINDINGS: Mediastinum/Lymph Nodes: Negative for pulmonary embolism. Soft tissue fullness in the hilar regions is similar to the previous examination. Small mediastinal lymph nodes appear to be stable.  No significant pericardial fluid. Mild dilatation of the distal esophagus with some high-density material. Lungs/Pleura: Trachea and mainstem bronchi are patent. There is extensive paraseptal emphysema. Evidence for a large blebs at the left lung apex. Focal parenchymal thickening at the right lung apex on sequence 7, image 18 which is unchanged since 2012. Evidence for centrilobular emphysema. Pleural-based nodule in the right upper lobe on sequence 7, image 55 roughly measure is 7 x 7 mm. This has minimally changed since 2016 but there has been mild enlargement since 2012. New ill-defined 7 mm nodular density along the anterior right lung on sequence 7, image 57. Pleural-based nodule along the right minor fissure may have enlarged and now measures up to 7 mm and previously measured 5 mm in 2016. There are some filling defects within right lower lobe airways. Few patchy densities along the dependent aspect of  the right lower lung. Again noted is a pleural-based nodule in the left upper lobe on sequence 7, image 73. There is also a stable 3 mm nodule in the left upper lobe on image 79. Stable pleural-based nodule in left lower lobe on image 89. Upper abdomen: No acute abnormality. Musculoskeletal: No acute bone abnormality. Review of the MIP images confirms the above findings. IMPRESSION: Negative for pulmonary embolism. Severe emphysematous changes throughout the lungs. Numerous indeterminate nodular densities in the lungs. Some of these nodules may be slowly enlarging and there is a new nodule along the anterior right upper lung as described. This new nodule could be inflammatory and recommend a follow-up chest CT in 3 months to look for stability or resolution. The other pulmonary nodules should also be evaluated at that time. Few patchy densities in the right lower lobe could represent atelectasis. A mild inflammatory process cannot be excluded. Electronically Signed   By: Richarda Overlie M.D.   On: 05/13/2016 17:58    I have personally reviewed and evaluated these images and lab results as part of my medical decision-making.   EKG Interpretation   Date/Time:  Wednesday May 13 2016 15:15:05 EDT Ventricular Rate:  84 PR Interval:  127 QRS Duration: 94 QT Interval:  357 QTC Calculation: 422 R Axis:   89 Text Interpretation:  Sinus rhythm Nonspecific T abnrm, anterolateral  leads Confirmed by Vikrant Pryce MD, Tiffiney Sparrow ([id number]) on 05/13/2016 4:18:23 PM      MDM  PT IS FEELING BETTER.  HE KNOWS TO STOP SMOKING.  HE WILL BE GIVEN AN ALBUTEROL INHALER BEFORE HE GOES.  HE WILL BE PLACED ON ZITHROMAX AND PREDNISONE.  HE IS ALSO GIVEN A RX FOR 10 PERCOCET.  HE KNOWS TO RETURN IF WORSE. Final diagnoses:  COPD with exacerbation (HCC)  Tobacco abuse  Cough with hemoptysis  Acute bronchitis, unspecified organism  Left inguinal hernia  Chest wall pain     Jacalyn Lefevre, MD 05/13/16 1814

## 2016-05-13 NOTE — ED Notes (Signed)
Notified RT that patient has breathing treatment ordered.

## 2016-05-13 NOTE — Discharge Instructions (Signed)

## 2016-05-18 MED FILL — AZITHROMYCIN 250 MG TABLET: 250 | 1 days supply | Qty: 4 | Fill #0

## 2016-05-18 MED FILL — predniSONE 10 MG TABS: 10 | 7 days supply | Qty: 42 | Fill #0

## 2016-06-22 ENCOUNTER — Emergency Department (HOSPITAL_COMMUNITY)
Admission: EM | Admit: 2016-06-22 | Discharge: 2016-06-22 | Disposition: A | Discharge status: Home / Self Care | Payer: Self-pay | Attending: Emergency Medicine | Admitting: Emergency Medicine

## 2016-06-22 ENCOUNTER — Encounter (HOSPITAL_COMMUNITY): Payer: Self-pay | Admitting: Emergency Medicine

## 2016-06-22 DIAGNOSIS — J029 Acute pharyngitis, unspecified: Secondary | ICD-10-CM | POA: Insufficient documentation

## 2016-06-22 DIAGNOSIS — L259 Unspecified contact dermatitis, unspecified cause: Secondary | ICD-10-CM | POA: Insufficient documentation

## 2016-06-22 DIAGNOSIS — F17210 Nicotine dependence, cigarettes, uncomplicated: Secondary | ICD-10-CM | POA: Insufficient documentation

## 2016-06-22 DIAGNOSIS — J439 Emphysema, unspecified: Secondary | ICD-10-CM | POA: Insufficient documentation

## 2016-06-22 MED ORDER — GABAPENTIN 100 MG PO CAPS: 100.0000 mg | ORAL_CAPSULE | Freq: Three times a day (TID) | ORAL | 0 refills | Status: DC

## 2016-06-22 MED ORDER — OXYCODONE-ACETAMINOPHEN 5-325 MG PO TABS: 1.0000 | ORAL_TABLET | Freq: Three times a day (TID) | ORAL | 0 refills | Status: DC | PRN

## 2016-06-22 MED ORDER — DIPHENHYDRAMINE HCL 25 MG PO CAPS: 25.0000 mg | ORAL_CAPSULE | Freq: Four times a day (QID) | ORAL | 0 refills | Status: DC | PRN

## 2016-06-22 MED ORDER — OXYCODONE-ACETAMINOPHEN 5-325 MG PO TABS
1.0000 | ORAL_TABLET | Freq: Once | ORAL | Status: AC
  Administered 2016-06-22: 1 via ORAL
  Filled 2016-06-22: qty 1

## 2016-06-22 MED ORDER — OXYCODONE-ACETAMINOPHEN 5-325 MG PO TABS: 2.0000 | ORAL_TABLET | ORAL | 0 refills | Status: DC | PRN

## 2016-06-22 MED ORDER — PREDNISONE 10 MG PO TABS: ORAL_TABLET | ORAL | 0 refills | Status: DC

## 2016-06-22 MED FILL — predniSONE 20 MG TABS: 20 | 21 days supply | Qty: 33 | Fill #0

## 2016-06-22 MED FILL — GABAPENTIN 100 MG CAPSULE: 100 | 21 days supply | Qty: 63 | Fill #0

## 2016-06-22 NOTE — ED Triage Notes (Signed)
Patient presents with rash to bilateral upper and lower extremities x1 month. Reports painful swallowing. Denies difficulty swallowing own secretions, SOB, no vocal changes, speaking in complete sentences.

## 2016-06-22 NOTE — ED Provider Notes (Signed)
Medical screening examination/treatment/procedure(s) were conducted as a shared visit with non-physician practitioner(s) and myself.  I personally evaluated the patient during the encounter.  55 year old male with a past medical problems presents with couple weeks worsening rash. It started on his left forearm and has progressed to his right forearm and bilateral lower joints. Delayed onset exposed areas of his body. Was recently on azithromycin but the rash seems to started before that. On exam he has diffuse excoriated areas over bilateral upper and lower extremities dorsal volar surfaces. He has multiple small papules all over the place as well. Very pruritic for the patient. Unsure of the cause hours suspected related to exposure of some sort. We'll start steroid taper and follow with dermatology.    Marily Memos, MD 06/23/16 [phone]

## 2016-06-22 NOTE — Discharge Instructions (Signed)
Please read and follow all provided instructions.  Your diagnoses today include:  1. Contact dermatitis    Tests performed today include: Vital signs. See below for your results today.   Medications prescribed:  Take as prescribed   Home care instructions:  Follow any educational materials contained in this packet.  Follow-up instructions: Please follow-up with Dermatology for further evaluation of symptoms and treatment   Return instructions:  Please return to the Emergency Department if you do not get better, if you get worse, or new symptoms OR  - Fever (temperature greater than 101.47F)  - Bleeding that does not stop with holding pressure to the area    -Severe pain (please note that you may be more sore the day after your accident)  - Chest Pain  - Difficulty breathing  - Severe nausea or vomiting  - Inability to tolerate food and liquids  - Passing out  - Skin becoming red around your wounds  - Change in mental status (confusion or lethargy)  - New numbness or weakness    Please return if you have any other emergent concerns.  Additional Information:  Your vital signs today were: BP 124/88    Pulse 88    Temp 98.3 F (36.8 C) (Oral)    Resp 20    SpO2 100%  If your blood pressure (BP) was elevated above 135/85 this visit, please have this repeated by your doctor within one month. ---------------

## 2016-06-22 NOTE — ED Provider Notes (Signed)
[facility]-EMERGENCY DEPT Provider Note   CSN: [id number] Arrival date & time: 06/22/16  1315  First Provider Contact:  First MD Initiated Contact with Patient 06/22/16 1352     By signing my name below, I, Alejandro Ferguson, attest that this documentation has been prepared under the direction and in the presence of non-physician practitioner, Audry Pili, PA-C. Electronically Signed: Freida Ferguson, Scribe. 06/22/2016. 2:01 PM.  History   Chief Complaint Chief Complaint  Patient presents with  . Rash    The history is provided by the patient. No language interpreter was used.    HPI Comments:  Alejandro Ferguson is a 55 y.o. male who presents to the Emergency Department complaining of gradually worsening, pruritic rash to his BUE and BLE x 1 month. He describes a burning sensation to the site. He also reports moderate pain; states he feels like someone is stabbing him with needles. Pt states the rash started on his left arm, then spread to his RUE and then to his legs, He denies rash to his torso. At this time time pt notes associated mild sore throat x 1 week. He denies fever and recent tick bite. He notes he has changed laundry detergents multiple times in the last month.  Past Medical History:  Diagnosis Date  . COPD (chronic obstructive pulmonary disease) (HCC)   . Emphysema   . ETOH abuse   . Pneumonia     Patient Active Problem List   Diagnosis Date Noted  . Pneumonia 08/25/2011  . Tobacco abuse 08/25/2011  . Hip pain, right 08/25/2011    Past Surgical History:  Procedure Laterality Date  . APPENDECTOMY    . CERVICAL DISC SURGERY    . left shoulder surgery       Home Medications    Prior to Admission medications   Medication Sig Start Date End Date Taking? Authorizing Provider  azithromycin (ZITHROMAX) 250 MG tablet TAKE AS DIRECTED 05/13/16   Jacalyn Lefevre, MD  oxyCODONE-acetaminophen (PERCOCET/ROXICET) 5-325 MG tablet Take 1 tablet by mouth every 4 (four) hours as needed for  severe pain. 05/13/16   Jacalyn Lefevre, MD  predniSONE (STERAPRED UNI-PAK 21 TAB) 10 MG (21) TBPK tablet Take 1 tablet (10 mg total) by mouth daily. Take 6 tabs by mouth daily  for 2 days, then 5 tabs for 2 days, then 4 tabs for 2 days, then 3 tabs for 2 days, 2 tabs for 2 days, then 1 tab by mouth daily for 2 days 05/13/16   Jacalyn Lefevre, MD    Family History Family History  Problem Relation Age of Onset  . Hypertension Mother   . Lung cancer      Social History Social History  Substance Use Topics  . Smoking status: Current Every Day Smoker    Packs/day: 0.25    Years: 25.00    Types: Cigarettes    Last attempt to quit: 07/01/2011  . Smokeless tobacco: Never Used  . Alcohol use 18.0 oz/week    30 Cans of beer per week     Comment: 6 beers a day ON 8/1 PATIENT STATES HE DOESNT DRINK     Allergies   Penicillins   Review of Systems Review of Systems  Constitutional: Negative for fever.  HENT: Positive for sore throat.   Respiratory: Negative for shortness of breath.   Cardiovascular: Negative for chest pain.  Skin: Positive for rash.    Physical Exam Updated Vital Signs BP 124/88   Pulse 88   Temp 98.3  F (36.8 C) (Oral)   Resp 20   SpO2 100%   Physical Exam  Constitutional: He is oriented to person, place, and time. He appears well-developed and well-nourished. No distress.  HENT:  Head: Normocephalic and atraumatic.  Eyes: Conjunctivae are normal.  Cardiovascular: Normal rate.   Pulmonary/Chest: Effort normal.  Neurological: He is alert and oriented to person, place, and time.  Skin: Skin is warm and dry.  Circumferential psoriatic rash noted on bilateral forearms as well as elbows. Noted on BLE shins circumferentially. Minimal excoriations noted. Non purulent. Non infectious. Non TTP.     Psychiatric: He has a normal mood and affect.  Nursing note and vitals reviewed.  ED Treatments / Results  DIAGNOSTIC STUDIES:  Oxygen Saturation is 100% on RA, normal  by my interpretation.    COORDINATION OF CARE:  1:58 PM Discussed treatment plan with pt at bedside and pt agreed to plan.  Labs (all labs ordered are listed, but only abnormal results are displayed) Labs Reviewed - No data to display  EKG  EKG Interpretation None      Radiology No results found.  Procedures Procedures   Medications Ordered in ED Medications - No data to display   Initial Impression / Assessment and Plan / ED Course  I have reviewed the triage vital signs and the nursing notes.  Pertinent labs & imaging results that were available during my care of the patient were reviewed by me and considered in my medical decision making (see chart for details).  Clinical Course   Final Clinical Impressions(s) / ED Diagnoses    Patient with contact dermatitis x 1 month. Appears to be in sun exposed areas. Instructed to avoid offending agent and to use unscented soaps, lotions, and detergents. Will treat with 2 wee steroid taper, benadryl, gabapentin, and percocet #5. Reviewed [place] drug database. No signs of secondary infection. Seen and evaluated by supervising physician. Follow up with Dermatology in 2-3 days. Return precautions discussed. Pt is safe for discharge at this time.   Final diagnoses:  Contact dermatitis    New Prescriptions New Prescriptions   No medications on file   I personally performed the services described in this documentation, which was scribed in my presence. The recorded information has been reviewed and is accurate.    Audry Pili, PA-C 06/22/16 1438    Marily Memos, MD 06/22/16 1725    Marily Memos, MD 06/23/16 0000

## 2016-09-04 ENCOUNTER — Ambulatory Visit: Payer: Self-pay

## 2016-09-28 ENCOUNTER — Ambulatory Visit: Payer: Self-pay | Admitting: Family Medicine

## 2016-10-12 ENCOUNTER — Ambulatory Visit: Payer: Self-pay | Admitting: Family Medicine

## 2017-03-15 ENCOUNTER — Emergency Department (HOSPITAL_COMMUNITY)
Admission: EM | Admit: 2017-03-15 | Discharge: 2017-03-15 | Disposition: A | Discharge status: Home / Self Care | Payer: Self-pay | Attending: Emergency Medicine | Admitting: Emergency Medicine

## 2017-03-15 ENCOUNTER — Encounter (HOSPITAL_COMMUNITY): Payer: Self-pay | Admitting: Emergency Medicine

## 2017-03-15 ENCOUNTER — Emergency Department (HOSPITAL_COMMUNITY): Payer: Self-pay

## 2017-03-15 DIAGNOSIS — R072 Precordial pain: Secondary | ICD-10-CM | POA: Insufficient documentation

## 2017-03-15 DIAGNOSIS — R059 Cough, unspecified: Secondary | ICD-10-CM

## 2017-03-15 DIAGNOSIS — R0602 Shortness of breath: Secondary | ICD-10-CM | POA: Insufficient documentation

## 2017-03-15 DIAGNOSIS — Z79899 Other long term (current) drug therapy: Secondary | ICD-10-CM | POA: Insufficient documentation

## 2017-03-15 DIAGNOSIS — J441 Chronic obstructive pulmonary disease with (acute) exacerbation: Secondary | ICD-10-CM | POA: Insufficient documentation

## 2017-03-15 DIAGNOSIS — R05 Cough: Secondary | ICD-10-CM

## 2017-03-15 DIAGNOSIS — R079 Chest pain, unspecified: Secondary | ICD-10-CM

## 2017-03-15 DIAGNOSIS — F17210 Nicotine dependence, cigarettes, uncomplicated: Secondary | ICD-10-CM | POA: Insufficient documentation

## 2017-03-15 LAB — I-STAT TROPONIN, ED
Troponin i, poc: 0 ng/mL (ref 0.00–0.08)
Troponin i, poc: 0 ng/mL (ref 0.00–0.08)

## 2017-03-15 LAB — BASIC METABOLIC PANEL
Anion gap: 10 (ref 5–15)
BUN: 9 mg/dL (ref 6–20)
CHLORIDE: 106 mmol/L (ref 101–111)
CO2: 24 mmol/L (ref 22–32)
CREATININE: 0.81 mg/dL (ref 0.61–1.24)
Calcium: 9.5 mg/dL (ref 8.9–10.3)
GFR calc Af Amer: >60 mL/min (ref >60)
GFR calc non Af Amer: >60 mL/min (ref >60)
GLUCOSE: 95 mg/dL (ref 65–99)
POTASSIUM: 4 mmol/L (ref 3.5–5.1)
SODIUM: 140 mmol/L (ref 135–145)

## 2017-03-15 LAB — CBC
HEMATOCRIT: 47.4 % (ref 39.0–52.0)
Hemoglobin: 16 g/dL (ref 13.0–17.0)
MCH: 31.7 pg (ref 26.0–34.0)
MCHC: 33.8 g/dL (ref 30.0–36.0)
MCV: 94 fL (ref 78.0–100.0)
PLATELETS: 356 10*3/uL (ref 150–400)
RBC: 5.04 MIL/uL (ref 4.22–5.81)
RDW: 13.1 % (ref 11.5–15.5)
WBC: 11.6 10*3/uL — ABNORMAL HIGH (ref 4.0–10.5)

## 2017-03-15 LAB — D-DIMER, QUANTITATIVE (NOT AT ARMC)

## 2017-03-15 MED ORDER — LEVOFLOXACIN 500 MG PO TABS: 500.0000 mg | ORAL_TABLET | Freq: Every day | ORAL | 0 refills | Status: AC

## 2017-03-15 MED ORDER — ALBUTEROL SULFATE HFA 108 (90 BASE) MCG/ACT IN AERS: 2.0000 | INHALATION_SPRAY | RESPIRATORY_TRACT | 2 refills | Status: DC | PRN

## 2017-03-15 MED ORDER — PREDNISONE 20 MG PO TABS
50.0000 mg | ORAL_TABLET | Freq: Once | ORAL | Status: AC
  Administered 2017-03-15: 50 mg via ORAL
  Filled 2017-03-15: qty 3

## 2017-03-15 MED ORDER — LEVOFLOXACIN 500 MG PO TABS
500.0000 mg | ORAL_TABLET | Freq: Once | ORAL | Status: AC
  Administered 2017-03-15: 500 mg via ORAL
  Filled 2017-03-15: qty 1

## 2017-03-15 MED ORDER — IPRATROPIUM-ALBUTEROL 0.5-2.5 (3) MG/3ML IN SOLN
3.0000 mL | Freq: Once | RESPIRATORY_TRACT | Status: AC
  Administered 2017-03-15: 3 mL via RESPIRATORY_TRACT
  Filled 2017-03-15: qty 3

## 2017-03-15 MED ORDER — OXYCODONE-ACETAMINOPHEN 5-325 MG PO TABS: 1.0000 | ORAL_TABLET | ORAL | 0 refills | Status: DC | PRN

## 2017-03-15 MED ORDER — KETOROLAC TROMETHAMINE 60 MG/2ML IM SOLN
30.0000 mg | Freq: Once | INTRAMUSCULAR | Status: AC
  Administered 2017-03-15: 30 mg via INTRAMUSCULAR
  Filled 2017-03-15: qty 2

## 2017-03-15 MED ORDER — OXYCODONE-ACETAMINOPHEN 5-325 MG PO TABS
1.0000 | ORAL_TABLET | Freq: Once | ORAL | Status: AC
  Administered 2017-03-15: 1 via ORAL
  Filled 2017-03-15: qty 1

## 2017-03-15 NOTE — ED Provider Notes (Signed)
[facility]-EMERGENCY DEPT Provider Note   CSN: [id number] Arrival date & time: 03/15/17  1450     History   Chief Complaint Chief Complaint  Patient presents with  . Chest Pain  . Shortness of Breath    HPI Alejandro Ferguson is a 56 y.o. male.  The history is provided by the patient.  Chest Pain   This is a new problem. The current episode started more than 2 days ago. The problem occurs hourly. The problem has been gradually worsening. The pain is associated with coughing. The pain is present in the substernal region and lateral region. The pain is at a severity of 6/10. The pain is moderate. The quality of the pain is described as pressure-like, sharp and heavy. The pain radiates to the upper back. The symptoms are aggravated by certain positions. Associated symptoms include cough, hemoptysis (Pt reports 1 bout of blood in cough), shortness of breath and sputum production. Pertinent negatives include no abdominal pain, no back pain, no claudication, no diaphoresis, no dizziness, no exertional chest pressure, no fever, no headaches, no irregular heartbeat, no leg pain, no lower extremity edema, no malaise/fatigue, no nausea, no near-syncope, no numbness, no orthopnea, no palpitations, no PND, no syncope, no vomiting and no weakness. He has tried nothing for the symptoms. The treatment provided no relief. Risk factors include smoking/tobacco exposure and male gender.  His past medical history is significant for COPD.    Past Medical History:  Diagnosis Date  . COPD (chronic obstructive pulmonary disease) (HCC)   . Emphysema   . ETOH abuse   . Pneumonia     Patient Active Problem List   Diagnosis Date Noted  . Pneumonia 08/25/2011  . Tobacco abuse 08/25/2011  . Hip pain, right 08/25/2011    Past Surgical History:  Procedure Laterality Date  . APPENDECTOMY    . CERVICAL DISC SURGERY    . left shoulder surgery         Home Medications    Prior to Admission medications     Medication Sig Start Date End Date Taking? Authorizing Provider  OVER THE COUNTER MEDICATION Take 1 tablet by mouth daily as needed (allergies). Over the counter allergy medication   Yes Historical Provider, MD  albuterol (PROVENTIL HFA;VENTOLIN HFA) 108 (90 Base) MCG/ACT inhaler Inhale 2 puffs into the lungs every 4 (four) hours as needed for wheezing or shortness of breath. 03/15/17   Stacy Gardner, MD  diphenhydrAMINE (BENADRYL) 25 mg capsule Take 1 capsule (25 mg total) by mouth every 6 (six) hours as needed. Patient not taking: Reported on 03/15/2017 06/22/16   Audry Pili, PA-C  gabapentin (NEURONTIN) 100 MG capsule Take 1 capsule (100 mg total) by mouth 3 (three) times daily. Patient not taking: Reported on 03/15/2017 06/22/16   Audry Pili, PA-C  levofloxacin (LEVAQUIN) 500 MG tablet Take 1 tablet (500 mg total) by mouth daily. 03/15/17 03/22/17  Stacy Gardner, MD  oxyCODONE-acetaminophen (PERCOCET/ROXICET) 5-325 MG tablet Take 1 tablet by mouth every 8 (eight) hours as needed for severe pain. Patient not taking: Reported on 03/15/2017 06/22/16   Audry Pili, PA-C  oxyCODONE-acetaminophen (PERCOCET/ROXICET) 5-325 MG tablet Take 1 tablet by mouth every 4 (four) hours as needed for severe pain. 03/15/17   Stacy Gardner, MD    Family History Family History  Problem Relation Age of Onset  . Hypertension Mother   . Lung cancer      Social History Social History  Substance Use Topics  . Smoking status: Current  Every Day Smoker    Packs/day: 0.25    Years: 25.00    Types: Cigarettes    Last attempt to quit: 07/01/2011  . Smokeless tobacco: Never Used  . Alcohol use 18.0 oz/week    30 Cans of beer per week     Comment: 6 beers a day ON 8/1 PATIENT STATES HE DOESNT DRINK     Allergies   Penicillins   Review of Systems Review of Systems  Constitutional: Negative for diaphoresis, fever and malaise/fatigue.  HENT: Positive for congestion and rhinorrhea. Negative for trouble swallowing.    Eyes: Negative for pain.  Respiratory: Positive for cough, hemoptysis (Pt reports 1 bout of blood in cough), sputum production and shortness of breath.   Cardiovascular: Positive for chest pain. Negative for palpitations, orthopnea, claudication, syncope, PND and near-syncope.  Gastrointestinal: Negative for abdominal pain, nausea and vomiting.  Genitourinary: Negative for dysuria.  Musculoskeletal: Negative for back pain.  Skin: Negative for rash.  Neurological: Negative for dizziness, weakness, numbness and headaches.  Psychiatric/Behavioral: Negative for confusion.     Physical Exam Updated Vital Signs BP 121/89   Pulse 93   Temp 98.5 F (36.9 C) (Oral)   Resp (!) 22   SpO2 93%   Physical Exam  Constitutional: He appears well-developed and well-nourished. No distress.  HENT:  Head: Normocephalic and atraumatic.  Eyes: Conjunctivae and EOM are normal.  Neck: Neck supple.  Cardiovascular: Normal rate and regular rhythm.   No murmur heard. Pulmonary/Chest: Effort normal. No respiratory distress. He has wheezes (scant expiratory wheezing). He has rhonchi in the right middle field, the right lower field, the left middle field and the left lower field.  Abdominal: Soft. There is no tenderness.  Musculoskeletal: He exhibits no edema.  Neurological: He is alert. GCS eye subscore is 4. GCS verbal subscore is 5. GCS motor subscore is 6.  Skin: Skin is warm and dry. No rash noted.  Psychiatric: He has a normal mood and affect.  Nursing note and vitals reviewed.    ED Treatments / Results  Labs (all labs ordered are listed, but only abnormal results are displayed) Labs Reviewed  CBC - Abnormal; Notable for the following:       Result Value   WBC 11.6 (*)    All other components within normal limits  BASIC METABOLIC PANEL  D-DIMER, QUANTITATIVE (NOT AT [facility])  I-STAT TROPOININ, ED  I-STAT TROPOININ, ED    EKG  EKG Interpretation  Date/Time:  Monday March 15 2017  14:59:41 EDT Ventricular Rate:  100 PR Interval:  126 QRS Duration: 96 QT Interval:  336 QTC Calculation: 433 R Axis:   86 Text Interpretation:  Normal sinus rhythm Normal ECG No significant change since last tracing Confirmed by LITTLE MD, RACHEL [phone]) on 03/15/2017 7:20:13 PM       Radiology Dg Chest 2 View  Result Date: 03/15/2017 CLINICAL DATA:  Chest pain short of breath EXAM: CHEST  2 VIEW COMPARISON:  Chest CT and chest x-ray 05/13/2016 FINDINGS: COPD with pulmonary hyperinflation. Pulmonary scarring and apical blebs bilaterally. Negative for heart failure.  Negative for pneumonia or mass lesion. IMPRESSION: COPD.  No acute abnormality. Electronically Signed   By: Marlan Palau M.D.   On: 03/15/2017 15:47    Procedures Procedures (including critical care time)  Medications Ordered in ED Medications  levofloxacin (LEVAQUIN) tablet 500 mg (not administered)  ipratropium-albuterol (DUONEB) 0.5-2.5 (3) MG/3ML nebulizer solution 3 mL (3 mLs Nebulization Given 03/15/17 1757)  predniSONE (DELTASONE)  tablet 50 mg (50 mg Oral Given 03/15/17 1757)  ketorolac (TORADOL) injection 30 mg (30 mg Intramuscular Given 03/15/17 1757)  oxyCODONE-acetaminophen (PERCOCET/ROXICET) 5-325 MG per tablet 1 tablet (1 tablet Oral Given 03/15/17 1901)     Initial Impression / Assessment and Plan / ED Course  I have reviewed the triage vital signs and the nursing notes.  Pertinent labs & imaging results that were available during my care of the patient were reviewed by me and considered in my medical decision making (see chart for details).     56 year old white male with history of undocumented COPD who presents in the setting of cough, chest pain, shortness of breath over the last 3 days. Patient reports this feels similar to previous bouts of pneumonia. He reports subjective fevers at home. Patient reports 1 bout of blood in cough yesterday. Patient reports he continues to smoke tobacco. He does not  have any home medications for COPD or other illnesses which he takes.  On arrival patient was hemodynamically stable and afebrile. Oxygen saturations within normal limits on room air. Chest x-ray reveals no large pneumonia or other acute cardiopulmonary at about a period initial troponin not elevated and no other significant electrolyte abdomen maladies or anemia noted. EKG reveals normal sinus rhythm with no signs of acute ischemia, ST segment elevation or depression or other significant abnormality. Patient is a low risk per heart score. I have low suspicion for ACS due to patient's complaint and more concern for COPD exacerbation versus atypical pneumonia. Patient given DuoNeb treatment as well as 50 mg of prednisone. Additionally due to reported hemoptysis d-dimer obtained. Have low suspicion for PE and patient is low risk per well's score.  D-dimer not significantly elevated and repeat troponin not elevated. Patient reported improvement in symptoms after DuoNeb treatment and Toradol. I have low suspicion for ACS, PE, aortic dissection or other acute emergent cardiac event. Presentation consistent with likely COPD exacerbation versus atypical pneumonia. We'll discharge home with albuterol inhaler and plan for use of Levaquin. Additionally will give short course of pain medication.. Patient given strict return precautions with plan of follow-up with PCP (given information on [facility]) for further management of this condition and stable at time of transfer of care. Patient in agreement with plan on reassessment reported improvement of all symptoms.  Final Clinical Impressions(s) / ED Diagnoses   Final diagnoses:  Chest pain, unspecified type  SOB (shortness of breath)  Cough  COPD exacerbation (HCC)    New Prescriptions New Prescriptions   ALBUTEROL (PROVENTIL HFA;VENTOLIN HFA) 108 (90 BASE) MCG/ACT INHALER    Inhale 2 puffs into the lungs every 4 (four) hours as needed for wheezing or  shortness of breath.   LEVOFLOXACIN (LEVAQUIN) 500 MG TABLET    Take 1 tablet (500 mg total) by mouth daily.   OXYCODONE-ACETAMINOPHEN (PERCOCET/ROXICET) 5-325 MG TABLET    Take 1 tablet by mouth every 4 (four) hours as needed for severe pain.     Stacy Gardner, MD 03/15/17 1959    Laurence Spates, MD 03/20/17 [phone]

## 2017-03-15 NOTE — ED Notes (Signed)
Patient was given a [place] Sandwich bag w/ a Sprite.

## 2017-03-15 NOTE — ED Triage Notes (Signed)
Pt to ER for 3 days of central/right chest pain radiating into back with cough and shortness of breath. Hx of pneumonia. Reports associated fevers, reports he has not been able to measure one but feels like he has one. Describes chest pain as pressure. Lungs are clear at this time. Pt is a/o x4.

## 2017-03-15 NOTE — ED Notes (Signed)
Pt departed in NAD, refused use of wheelchair.  

## 2017-07-09 ENCOUNTER — Emergency Department (HOSPITAL_COMMUNITY): Payer: Self-pay

## 2017-07-09 ENCOUNTER — Inpatient Hospital Stay (HOSPITAL_COMMUNITY)
Admission: EM | Admit: 2017-07-09 | Discharge: 2017-07-11 | DRG: 392 | Disposition: A | Discharge status: Home / Self Care | Payer: Self-pay | Attending: Oncology | Admitting: Oncology

## 2017-07-09 ENCOUNTER — Encounter (HOSPITAL_COMMUNITY): Payer: Self-pay | Admitting: *Deleted

## 2017-07-09 DIAGNOSIS — F17210 Nicotine dependence, cigarettes, uncomplicated: Secondary | ICD-10-CM | POA: Diagnosis present

## 2017-07-09 DIAGNOSIS — R109 Unspecified abdominal pain: Secondary | ICD-10-CM | POA: Diagnosis present

## 2017-07-09 DIAGNOSIS — F1010 Alcohol abuse, uncomplicated: Secondary | ICD-10-CM | POA: Diagnosis present

## 2017-07-09 DIAGNOSIS — R1011 Right upper quadrant pain: Principal | ICD-10-CM | POA: Diagnosis present

## 2017-07-09 DIAGNOSIS — Z7289 Other problems related to lifestyle: Secondary | ICD-10-CM

## 2017-07-09 DIAGNOSIS — J449 Chronic obstructive pulmonary disease, unspecified: Secondary | ICD-10-CM | POA: Diagnosis present

## 2017-07-09 DIAGNOSIS — Z880 Allergy status to penicillin: Secondary | ICD-10-CM

## 2017-07-09 DIAGNOSIS — J439 Emphysema, unspecified: Secondary | ICD-10-CM | POA: Diagnosis present

## 2017-07-09 DIAGNOSIS — F1410 Cocaine abuse, uncomplicated: Secondary | ICD-10-CM | POA: Diagnosis present

## 2017-07-09 DIAGNOSIS — F1290 Cannabis use, unspecified, uncomplicated: Secondary | ICD-10-CM | POA: Diagnosis present

## 2017-07-09 DIAGNOSIS — R1013 Epigastric pain: Secondary | ICD-10-CM

## 2017-07-09 HISTORY — DX: Anxiety disorder, unspecified: F41.9

## 2017-07-09 HISTORY — DX: Other psychoactive substance abuse, uncomplicated: F19.10

## 2017-07-09 HISTORY — DX: Respiratory tuberculosis unspecified: A15.9

## 2017-07-09 LAB — COMPREHENSIVE METABOLIC PANEL
ALBUMIN: 3.7 g/dL (ref 3.5–5.0)
ALBUMIN: 4.2 g/dL (ref 3.5–5.0)
ALK PHOS: 85 U/L (ref 38–126)
ALT: 18 U/L (ref 17–63)
ALT: 18 U/L (ref 17–63)
ANION GAP: 11 (ref 5–15)
AST: 20 U/L (ref 15–41)
AST: 22 U/L (ref 15–41)
Alkaline Phosphatase: 89 U/L (ref 38–126)
Anion gap: 9 (ref 5–15)
BILIRUBIN TOTAL: 0.8 mg/dL (ref 0.3–1.2)
BUN: 9 mg/dL (ref 6–20)
BUN: 6 mg/dL (ref 6–20)
CALCIUM: 9.3 mg/dL (ref 8.9–10.3)
CHLORIDE: 105 mmol/L (ref 101–111)
CO2: 23 mmol/L (ref 22–32)
CO2: 25 mmol/L (ref 22–32)
CREATININE: 0.95 mg/dL (ref 0.61–1.24)
Calcium: 9.2 mg/dL (ref 8.9–10.3)
Chloride: 101 mmol/L (ref 101–111)
Creatinine, Ser: 0.84 mg/dL (ref 0.61–1.24)
GFR calc Af Amer: >60 mL/min (ref >60)
GFR calc non Af Amer: >60 mL/min (ref >60)
GFR calc non Af Amer: >60 mL/min (ref >60)
GLUCOSE: 136 mg/dL — AB (ref 65–99)
GLUCOSE: 103 mg/dL — AB (ref 65–99)
Potassium: 4.4 mmol/L (ref 3.5–5.1)
Potassium: 4 mmol/L (ref 3.5–5.1)
SODIUM: 137 mmol/L (ref 135–145)
SODIUM: 137 mmol/L (ref 135–145)
Total Bilirubin: 0.6 mg/dL (ref 0.3–1.2)
Total Protein: 6.3 g/dL — ABNORMAL LOW (ref 6.5–8.1)
Total Protein: 7.3 g/dL (ref 6.5–8.1)

## 2017-07-09 LAB — I-STAT CHEM 8, ED
BUN: 9 mg/dL (ref 6–20)
CALCIUM ION: 1.17 mmol/L (ref 1.15–1.40)
Chloride: 101 mmol/L (ref 101–111)
Creatinine, Ser: 0.9 mg/dL (ref 0.61–1.24)
Glucose, Bld: 120 mg/dL — ABNORMAL HIGH (ref 65–99)
HEMATOCRIT: 49 % (ref 39.0–52.0)
HEMOGLOBIN: 16.7 g/dL (ref 13.0–17.0)
Potassium: 4.1 mmol/L (ref 3.5–5.1)
SODIUM: 138 mmol/L (ref 135–145)
TCO2: 26 mmol/L (ref 0–100)

## 2017-07-09 LAB — URINALYSIS, ROUTINE W REFLEX MICROSCOPIC
BILIRUBIN URINE: NEGATIVE
GLUCOSE, UA: NEGATIVE mg/dL
HGB URINE DIPSTICK: NEGATIVE
Ketones, ur: NEGATIVE mg/dL
Leukocytes, UA: NEGATIVE
Nitrite: NEGATIVE
Protein, ur: NEGATIVE mg/dL
SPECIFIC GRAVITY, URINE: 1.011 (ref 1.005–1.030)
pH: 7 (ref 5.0–8.0)

## 2017-07-09 LAB — CBC
HCT: 48.1 % (ref 39.0–52.0)
HEMOGLOBIN: 16.3 g/dL (ref 13.0–17.0)
MCH: 31.7 pg (ref 26.0–34.0)
MCHC: 33.9 g/dL (ref 30.0–36.0)
MCV: 93.4 fL (ref 78.0–100.0)
PLATELETS: 251 10*3/uL (ref 150–400)
RBC: 5.15 MIL/uL (ref 4.22–5.81)
RDW: 13.2 % (ref 11.5–15.5)
WBC: 10.6 10*3/uL — ABNORMAL HIGH (ref 4.0–10.5)

## 2017-07-09 LAB — LACTATE DEHYDROGENASE: LDH: 172 U/L (ref 98–192)

## 2017-07-09 LAB — RAPID URINE DRUG SCREEN, HOSP PERFORMED
Amphetamines: NOT DETECTED
BENZODIAZEPINES: NOT DETECTED
Barbiturates: NOT DETECTED
COCAINE: POSITIVE — AB
OPIATES: NOT DETECTED
Tetrahydrocannabinol: POSITIVE — AB

## 2017-07-09 LAB — I-STAT TROPONIN, ED: Troponin i, poc: 0 ng/mL (ref 0.00–0.08)

## 2017-07-09 LAB — ETHANOL: Alcohol, Ethyl (B): <5 mg/dL (ref <5)

## 2017-07-09 LAB — LIPASE, BLOOD: Lipase: 93 U/L — ABNORMAL HIGH (ref 11–51)

## 2017-07-09 MED ORDER — LORAZEPAM 2 MG/ML IJ SOLN
1.0000 mg | Freq: Four times a day (QID) | INTRAMUSCULAR | Status: DC | PRN
  Administered 2017-07-10 – 2017-07-11 (×2): 1 mg via INTRAVENOUS
  Filled 2017-07-09 (×2): qty 1

## 2017-07-09 MED ORDER — ALBUTEROL SULFATE (2.5 MG/3ML) 0.083% IN NEBU
3.0000 mL | INHALATION_SOLUTION | RESPIRATORY_TRACT | Status: DC | PRN
Start: 2017-07-09 — End: 2017-07-11

## 2017-07-09 MED ORDER — SENNOSIDES-DOCUSATE SODIUM 8.6-50 MG PO TABS
2.0000 | ORAL_TABLET | Freq: Every day | ORAL | Status: DC
  Administered 2017-07-09 – 2017-07-10 (×2): 2 via ORAL
  Filled 2017-07-09 (×2): qty 2

## 2017-07-09 MED ORDER — HYDROMORPHONE HCL 1 MG/ML IJ SOLN
1.0000 mg | Freq: Once | INTRAMUSCULAR | Status: AC
  Administered 2017-07-09: 1 mg via INTRAVENOUS
  Filled 2017-07-09: qty 1

## 2017-07-09 MED ORDER — ONDANSETRON HCL 4 MG/2ML IJ SOLN
4.0000 mg | Freq: Once | INTRAMUSCULAR | Status: AC
  Administered 2017-07-09: 4 mg via INTRAVENOUS
  Filled 2017-07-09: qty 2

## 2017-07-09 MED ORDER — LORAZEPAM 2 MG/ML IJ SOLN
1.0000 mg | Freq: Once | INTRAMUSCULAR | Status: AC | PRN
  Administered 2017-07-09: 1 mg via INTRAVENOUS
  Filled 2017-07-09: qty 1

## 2017-07-09 MED ORDER — OXYCODONE-ACETAMINOPHEN 7.5-325 MG PO TABS
1.0000 | ORAL_TABLET | ORAL | Status: DC | PRN
  Administered 2017-07-09 – 2017-07-11 (×7): 1 via ORAL
  Filled 2017-07-09 (×8): qty 1

## 2017-07-09 MED ORDER — ADULT MULTIVITAMIN W/MINERALS CH
1.0000 | ORAL_TABLET | Freq: Every day | ORAL | Status: DC
  Administered 2017-07-09 – 2017-07-11 (×3): 1 via ORAL
  Filled 2017-07-09 (×3): qty 1

## 2017-07-09 MED ORDER — HYDROMORPHONE HCL 1 MG/ML IJ SOLN
1.0000 mg | INTRAMUSCULAR | Status: DC | PRN
  Administered 2017-07-09 – 2017-07-11 (×7): 1 mg via INTRAVENOUS
  Filled 2017-07-09 (×7): qty 1

## 2017-07-09 MED ORDER — SODIUM CHLORIDE 0.9% FLUSH: 3.0000 mL | INTRAVENOUS | Status: DC | PRN

## 2017-07-09 MED ORDER — SODIUM CHLORIDE 0.9 % IV SOLN: 250.0000 mL | INTRAVENOUS | Status: DC | PRN

## 2017-07-09 MED ORDER — VITAMIN B-1 100 MG PO TABS
100.0000 mg | ORAL_TABLET | Freq: Every day | ORAL | Status: DC
  Administered 2017-07-09 – 2017-07-11 (×3): 100 mg via ORAL
  Filled 2017-07-09 (×3): qty 1

## 2017-07-09 MED ORDER — ONDANSETRON HCL 4 MG/2ML IJ SOLN: 4.0000 mg | Freq: Four times a day (QID) | INTRAMUSCULAR | Status: DC | PRN

## 2017-07-09 MED ORDER — LORAZEPAM 1 MG PO TABS
1.0000 mg | ORAL_TABLET | Freq: Four times a day (QID) | ORAL | Status: DC | PRN
  Administered 2017-07-10 – 2017-07-11 (×2): 1 mg via ORAL
  Filled 2017-07-09 (×2): qty 1

## 2017-07-09 MED ORDER — GI COCKTAIL ~~LOC~~
30.0000 mL | Freq: Once | ORAL | Status: AC
  Administered 2017-07-09: 30 mL via ORAL
  Filled 2017-07-09: qty 30

## 2017-07-09 MED ORDER — FENTANYL CITRATE (PF) 100 MCG/2ML IJ SOLN
100.0000 ug | Freq: Once | INTRAMUSCULAR | Status: AC
  Administered 2017-07-09: 100 ug via INTRAVENOUS
  Filled 2017-07-09: qty 2

## 2017-07-09 MED ORDER — THIAMINE HCL 100 MG/ML IJ SOLN
100.0000 mg | Freq: Every day | INTRAMUSCULAR | Status: DC
  Filled 2017-07-09: qty 2

## 2017-07-09 MED ORDER — IOPAMIDOL (ISOVUE-370) INJECTION 76%
INTRAVENOUS | Status: AC
  Administered 2017-07-09: 100 mL
  Filled 2017-07-09: qty 100

## 2017-07-09 MED ORDER — SODIUM CHLORIDE 0.9 % IV BOLUS (SEPSIS)
1000.0000 mL | Freq: Once | INTRAVENOUS | Status: AC
  Administered 2017-07-09: 1000 mL via INTRAVENOUS

## 2017-07-09 MED ORDER — ONDANSETRON HCL 4 MG PO TABS
8.0000 mg | ORAL_TABLET | Freq: Four times a day (QID) | ORAL | Status: DC | PRN
  Administered 2017-07-10 – 2017-07-11 (×2): 8 mg via ORAL
  Filled 2017-07-09 (×3): qty 2

## 2017-07-09 MED ORDER — NICOTINE 14 MG/24HR TD PT24
14.0000 mg | MEDICATED_PATCH | Freq: Every day | TRANSDERMAL | Status: DC
  Administered 2017-07-09 – 2017-07-10 (×2): 14 mg via TRANSDERMAL
  Filled 2017-07-09 (×2): qty 1

## 2017-07-09 MED ORDER — SODIUM CHLORIDE 0.9% FLUSH
3.0000 mL | Freq: Two times a day (BID) | INTRAVENOUS | Status: DC
  Administered 2017-07-09 – 2017-07-10 (×3): 3 mL via INTRAVENOUS

## 2017-07-09 MED ORDER — FAMOTIDINE IN NACL 20-0.9 MG/50ML-% IV SOLN
20.0000 mg | Freq: Once | INTRAVENOUS | Status: AC
  Administered 2017-07-09: 20 mg via INTRAVENOUS
  Filled 2017-07-09: qty 50

## 2017-07-09 MED ORDER — ENOXAPARIN SODIUM 40 MG/0.4ML ~~LOC~~ SOLN
40.0000 mg | SUBCUTANEOUS | Status: DC
  Filled 2017-07-09 (×2): qty 0.4

## 2017-07-09 MED ORDER — FOLIC ACID 1 MG PO TABS
1.0000 mg | ORAL_TABLET | Freq: Every day | ORAL | Status: DC
  Administered 2017-07-09 – 2017-07-11 (×3): 1 mg via ORAL
  Filled 2017-07-09 (×3): qty 1

## 2017-07-09 NOTE — ED Notes (Signed)
Attempted report x1. 

## 2017-07-09 NOTE — ED Notes (Addendum)
Patient transported to x-ray. ?

## 2017-07-09 NOTE — ED Provider Notes (Addendum)
[facility]-EMERGENCY DEPT Provider Note   CSN: [id number] Arrival date & time: 07/09/17  0905     History   Chief Complaint Chief Complaint  Patient presents with  . Chest Pain  . Abdominal Pain    HPI Alejandro Ferguson is a 56 y.o. male.  56 yo M with a chief complaint of abdominal pain. Rest of the epigastrium and radiates down into his lower abdomen. Happened about an hour ago is sharp and severe. He is unable to get comfortable. Has some nausea and vomiting. Denies diarrhea. Denies fever. Denies trauma.   The history is provided by the patient.  Chest Pain   Associated symptoms include abdominal pain, nausea and vomiting. Pertinent negatives include no fever, no headaches, no palpitations and no shortness of breath.  Abdominal Pain   This is a new problem. The current episode started less than 1 hour ago. The problem occurs constantly. The problem has not changed since onset.The pain is located in the generalized abdominal region. The quality of the pain is sharp and shooting. The pain is at a severity of 10/10. The pain is severe. Associated symptoms include nausea and vomiting. Pertinent negatives include fever, diarrhea, headaches, arthralgias and myalgias. Nothing aggravates the symptoms. Nothing relieves the symptoms.    Past Medical History:  Diagnosis Date  . COPD (chronic obstructive pulmonary disease) (HCC)   . Emphysema   . ETOH abuse   . Pneumonia     Patient Active Problem List   Diagnosis Date Noted  . Intractable abdominal pain 07/09/2017  . Pneumonia 08/25/2011  . Tobacco abuse 08/25/2011  . Hip pain, right 08/25/2011    Past Surgical History:  Procedure Laterality Date  . APPENDECTOMY    . CERVICAL DISC SURGERY    . left shoulder surgery         Home Medications    Prior to Admission medications   Medication Sig Start Date End Date Taking? Authorizing Provider  albuterol (PROVENTIL HFA;VENTOLIN HFA) 108 (90 Base) MCG/ACT inhaler Inhale 2 puffs into  the lungs every 4 (four) hours as needed for wheezing or shortness of breath. 03/15/17   Stacy Gardner, MD  diphenhydrAMINE (BENADRYL) 25 mg capsule Take 1 capsule (25 mg total) by mouth every 6 (six) hours as needed. Patient not taking: Reported on 03/15/2017 06/22/16   Audry Pili, PA-C  gabapentin (NEURONTIN) 100 MG capsule Take 1 capsule (100 mg total) by mouth 3 (three) times daily. Patient not taking: Reported on 03/15/2017 06/22/16   Audry Pili, PA-C  OVER THE COUNTER MEDICATION Take 1 tablet by mouth daily as needed (allergies). Over the counter allergy medication    [provider]  oxyCODONE-acetaminophen (PERCOCET/ROXICET) 5-325 MG tablet Take 1 tablet by mouth every 8 (eight) hours as needed for severe pain. Patient not taking: Reported on 03/15/2017 06/22/16   Audry Pili, PA-C  oxyCODONE-acetaminophen (PERCOCET/ROXICET) 5-325 MG tablet Take 1 tablet by mouth every 4 (four) hours as needed for severe pain. 03/15/17   Stacy Gardner, MD    Family History Family History  Problem Relation Age of Onset  . Hypertension Mother   . Lung cancer Unknown     Social History Social History  Substance Use Topics  . Smoking status: Current Every Day Smoker    Packs/day: 0.25    Years: 25.00    Types: Cigarettes    Last attempt to quit: 07/01/2011  . Smokeless tobacco: Never Used  . Alcohol use 18.0 oz/week    30 Cans of beer  per week     Comment: 6 beers a day ON 8/1 PATIENT STATES HE DOESNT DRINK     Allergies   Penicillins   Review of Systems Review of Systems  Constitutional: Negative for chills and fever.  HENT: Negative for congestion and facial swelling.   Eyes: Negative for discharge and visual disturbance.  Respiratory: Negative for shortness of breath.   Cardiovascular: Negative for chest pain and palpitations.  Gastrointestinal: Positive for abdominal pain, nausea and vomiting. Negative for diarrhea.  Musculoskeletal: Negative for arthralgias and myalgias.    Skin: Negative for color change and rash.  Neurological: Negative for tremors, syncope and headaches.  Psychiatric/Behavioral: Negative for confusion and dysphoric mood.     Physical Exam Updated Vital Signs BP 111/75   Pulse 94   Temp 98.5 F (36.9 C) (Oral)   Resp 18   SpO2 98%   Physical Exam  Constitutional: He is oriented to person, place, and time. He appears well-developed and well-nourished.  HENT:  Head: Normocephalic and atraumatic.  Eyes: Pupils are equal, round, and reactive to light. EOM are normal.  Neck: Normal range of motion. Neck supple. No JVD present.  Cardiovascular: Normal rate and regular rhythm.  Exam reveals no gallop and no friction rub.   No murmur heard. Pulmonary/Chest: No respiratory distress. He has no wheezes.  Abdominal: He exhibits no distension. There is tenderness (worst to the epigastrium). There is guarding. There is no rebound.  Musculoskeletal: Normal range of motion.  Neurological: He is alert and oriented to person, place, and time.  Skin: No rash noted. No pallor.  Psychiatric: He has a normal mood and affect. His behavior is normal.  Nursing note and vitals reviewed.    ED Treatments / Results  Labs (all labs ordered are listed, but only abnormal results are displayed) Labs Reviewed  LIPASE, BLOOD - Abnormal; Notable for the following:       Result Value   Lipase 93 (*)    All other components within normal limits  COMPREHENSIVE METABOLIC PANEL - Abnormal; Notable for the following:    Glucose, Bld 136 (*)    Total Protein 6.3 (*)    All other components within normal limits  CBC - Abnormal; Notable for the following:    WBC 10.6 (*)    All other components within normal limits  URINALYSIS, ROUTINE W REFLEX MICROSCOPIC - Abnormal; Notable for the following:    Color, Urine STRAW (*)    All other components within normal limits  I-STAT CHEM 8, ED - Abnormal; Notable for the following:    Glucose, Bld 120 (*)    All  other components within normal limits  RAPID URINE DRUG SCREEN, HOSP PERFORMED  ETHANOL  I-STAT TROPONIN, ED    EKG  EKG Interpretation None       Radiology Dg Chest 2 View  Result Date: 07/09/2017 CLINICAL DATA:  Central and right chest pain radiating to the back. Cough and shortness of breath. EXAM: CHEST  2 VIEW COMPARISON:  03/15/2017 FINDINGS: Severe emphysematous changes with large blebs at the left lung apex. No new airspace disease. No evidence for pulmonary edema. Heart and mediastinum are within normal limits. No large pleural effusions. Degenerative endplate changes in thoracic spine. Negative for a pneumothorax. IMPRESSION: Emphysema without acute chest findings. Electronically Signed   By: Richarda Overlie M.D.   On: 07/09/2017 09:56   Ct Angio Abd/pel W/ And/or W/o  Result Date: 07/09/2017 CLINICAL DATA:  Abdominal rigidity and severe,  diffuse, abdominal pain beginning about an hour ago. EXAM: CTA ABDOMEN AND PELVIS WITH CONTRAST TECHNIQUE: Multidetector CT imaging of the abdomen and pelvis was performed using the standard protocol during bolus administration of intravenous contrast. Multiplanar reconstructed images and MIPs were obtained and reviewed to evaluate the vascular anatomy. CONTRAST:  100 mL Isovue 370 IV COMPARISON:  None. FINDINGS: VASCULAR Aorta: Minimal calcified plaque in the infrarenal segment. No aneurysm, dissection, or stenosis. Celiac: Patent without evidence of aneurysm, dissection, vasculitis or significant stenosis. SMA: Patent without evidence of aneurysm, dissection, vasculitis or significant stenosis. Renals: Duplicated on the left, superior dominant, both widely patent. Single right, widely patent. IMA: Patent without evidence of aneurysm, dissection, vasculitis or significant stenosis. Inflow: Mild scattered calcified plaque. No aneurysm, dissection, or stenosis. Proximal Outflow: Bilateral common femoral and visualized portions of the superficial and profunda  femoral arteries are patent without evidence of aneurysm, dissection, vasculitis or significant stenosis. Veins: Dedicated venous phase imaging not obtained. Review of the MIP images confirms the above findings. NON-VASCULAR Lower chest: Triangular 7 mm pleural-based nodule, inferior lingula image 5/7, present since 07/21/2011 suggesting benignity e.g. intrapulmonary lymph node. No pleural or pericardial effusion. Hepatobiliary: No focal liver abnormality is seen. No gallstones, gallbladder wall thickening, or biliary dilatation. Pancreas: Unremarkable. No pancreatic ductal dilatation or surrounding inflammatory changes. Spleen: Normal in size without focal abnormality. Adrenals/Urinary Tract: Normal adrenals. Stable 3 cm upper pole left renal cyst. No hydronephrosis. Urinary bladder physiologically distended. Stomach/Bowel: Stomach, small bowel, and colon are nondilated. Appendix surgically absent. Proximal sigmoid colon protrudes into left inguinal hernia without obstruction or strangulation. Lymphatic: No abdominal or pelvic adenopathy localized. Reproductive: Prostatic enlargement. Enlarged lobular seminal vesicles. No discrete mass. Other: No ascites.  No free air. Musculoskeletal: Left inguinal hernia involving proximal sigmoid colon without obstruction or strangulation. Small anterior endplate spurs throughout the lumbar spine. Benign bone island in the right femoral neck stable since 08/24/2011. Negative for fracture or worrisome bone lesion. IMPRESSION: VASCULAR 1. Minimal aortoiliac arterial plaque. 2. No significant proximal mesenteric arterial occlusive disease. NON-VASCULAR 1. Left inguinal hernia involving proximal sigmoid colon without obstruction or strangulation. 2. Additional nonacute findings including Stable left renal cyst Probable intrapulmonary lymph node in the lingula Electronically Signed   By: Corlis Leak  Hassell M.D.   On: 07/09/2017 10:56   [place] Abdomen Limited Ruq  Result Date:  07/09/2017 CLINICAL DATA:  Right upper quadrant abdominal pain EXAM: ULTRASOUND ABDOMEN LIMITED RIGHT UPPER QUADRANT COMPARISON:  CT scan from earlier today. FINDINGS: Gallbladder: No gallstones or gallbladder wall thickening. No pericholecystic fluid. The sonographer reports no sonographic Murphy's sign. Common bile duct: Diameter: 4 mm Liver: Heterogeneous echotexture without a focal mass lesion evident. IMPRESSION: No acute findings on this limited right upper quadrant ultrasound. Electronically Signed   By: Kennith CenterEric  Mansell M.D.   On: 07/09/2017 12:46    Procedures Procedures (including critical care time)  Medications Ordered in ED Medications  sodium chloride 0.9 % bolus 1,000 mL (0 mLs Intravenous Stopped 07/09/17 1042)  fentaNYL (SUBLIMAZE) injection 100 mcg (100 mcg Intravenous Given 07/09/17 0934)  ondansetron (ZOFRAN) injection 4 mg (4 mg Intravenous Given 07/09/17 0933)  fentaNYL (SUBLIMAZE) injection 100 mcg (100 mcg Intravenous Given 07/09/17 0959)  iopamidol (ISOVUE-370) 76 % injection (100 mLs  Contrast Given 07/09/17 1005)  HYDROmorphone (DILAUDID) injection 1 mg (1 mg Intravenous Given 07/09/17 1030)  HYDROmorphone (DILAUDID) injection 1 mg (1 mg Intravenous Given 07/09/17 1046)  gi cocktail (Maalox,Lidocaine,Donnatal) (30 mLs Oral Given 07/09/17 1138)  ondansetron (ZOFRAN)  injection 4 mg (4 mg Intravenous Given 07/09/17 1139)  HYDROmorphone (DILAUDID) injection 1 mg (1 mg Intravenous Given 07/09/17 1139)     Initial Impression / Assessment and Plan / ED Course  I have reviewed the triage vital signs and the nursing notes.  Pertinent labs & imaging results that were available during my care of the patient were reviewed by me and considered in my medical decision making (see chart for details).     56 yo M With a chief complaint of sudden severe abdominal pain. The patient is writhing around on the bed on my initial exam and concern for acute aortic dissection. I expedited his CAT  scan to obtain images without labs.   CT scan without acute pathology. The patient does have a mild elevation of his lipase. However he continues to have severe abdominal pain is writhing around on the bed. He was given 3 mg of Dilaudid and 200 mics of fentanyl. He had some very mild improvement. Due to his continued severe sudden abdominal pain he was evaluated by the general surgeons. They do not feel that he has an acute abdomen requiring laparotomy at this time. Ill admit for continued pain management and observation.  CRITICAL CARE Performed by: Rae Roam   Total critical care time: 35 minutes  Critical care time was exclusive of separately billable procedures and treating other patients.  Critical care was necessary to treat or prevent imminent or life-threatening deterioration.  Critical care was time spent personally by me on the following activities: development of treatment plan with patient and/or surrogate as well as nursing, discussions with consultants, evaluation of patient's response to treatment, examination of patient, obtaining history from patient or surrogate, ordering and performing treatments and interventions, ordering and review of laboratory studies, ordering and review of radiographic studies, pulse oximetry and re-evaluation of patient's condition.   The patients results and plan were reviewed and discussed.   Any x-rays performed were independently reviewed by myself.     Differential diagnosis were considered with the presenting HPI.  Medications  sodium chloride 0.9 % bolus 1,000 mL (0 mLs Intravenous Stopped 07/09/17 1042)  fentaNYL (SUBLIMAZE) injection 100 mcg (100 mcg Intravenous Given 07/09/17 0934)  ondansetron (ZOFRAN) injection 4 mg (4 mg Intravenous Given 07/09/17 0933)  fentaNYL (SUBLIMAZE) injection 100 mcg (100 mcg Intravenous Given 07/09/17 0959)  iopamidol (ISOVUE-370) 76 % injection (100 mLs  Contrast Given 07/09/17 1005)   HYDROmorphone (DILAUDID) injection 1 mg (1 mg Intravenous Given 07/09/17 1030)  HYDROmorphone (DILAUDID) injection 1 mg (1 mg Intravenous Given 07/09/17 1046)  gi cocktail (Maalox,Lidocaine,Donnatal) (30 mLs Oral Given 07/09/17 1138)  ondansetron (ZOFRAN) injection 4 mg (4 mg Intravenous Given 07/09/17 1139)  HYDROmorphone (DILAUDID) injection 1 mg (1 mg Intravenous Given 07/09/17 1139)    Vitals:   07/09/17 0916 07/09/17 0938 07/09/17 1200 07/09/17 1252  BP: (!) 85/65 (!) 146/103 (!) 135/95 111/75  Pulse: 70 89  94  Resp: 18 (!) 23 18 18   Temp: 98.5 F (36.9 C)     TempSrc: Oral     SpO2: 98% 100% 96% 98%    Final diagnoses:  Epigastric pain  RUQ abdominal pain    Admission/ observation were discussed with the admitting physician, patient and/or family and they are comfortable with the plan.    Final Clinical Impressions(s) / ED Diagnoses   Final diagnoses:  Epigastric pain  RUQ abdominal pain    New Prescriptions New Prescriptions   No medications on file  Melene Plan, DO 07/09/17 1323    Melene Plan, DO 07/09/17 [phone]

## 2017-07-09 NOTE — ED Notes (Signed)
Taken to ct from this rn

## 2017-07-09 NOTE — H&P (Signed)
Date: 07/09/2017               Patient Name:  Alejandro Ferguson MRN: [id number]  DOB: 11/10/1961 Age / Sex: 56 y.o., male   PCP: Patient, No Pcp Per         Medical Service: Internal Medicine Teaching Service         Attending Physician: Dr. Levert Ferguson, Alejandro M, MD    First Contact: Dr. Scherrie GerlachJennifer Jo Ferguson Pager: [phone]  Second Contact: Dr. Valentino NoseNathan Ferguson Pager: [phone]       After Hours (After 5p/  First Contact Pager: [phone]  weekends / holidays): Second Contact Pager: [phone]   Chief Complaint: severe abdominal pain  History of Present Illness: [name] is a 56yo male with PMH significant for COPD, EtOH abuse, and substance use (including cocaine and marijuana) who presents on 8/10 with severe and sharp abdominal pain that started this morning while he was working. He describes the pain as very sharp, like "someone is stabbing me with a knife". The pain is diffuse and migrates. Initially, his pain was mostly epigastric. On our exam, he complains of diffuse abdominal pain that is worst in the lower abdomen. The pain does not radiate to his back. No exacerbating or alleviating factors, including laying still vs moving. Denies nausea, vomiting, diarrhea, constipation, blood in stools, or changes in BMs. Denies fever, chills, and weight loss. Denies hematuria or dysuria. Denies trauma. Had an appendectomy many years ago, no other abdominal surgeries.  In the ER, he was initially hypotensive to 85/65 but increased to 146/103 with fluids. HR 70, afebrile, satting well on room air. He endorsed 10/10 pain. Initial concern was for aortic dissection and CT-A abdomen/pelvis was obtained, which showed no significant proximal mesenteric arterial occlusive disease, left inguinal hernia without obstruction or strangulation, and no kidney stones. He received 3mg  Dilaudid and 200mcg fentanyl with some improvement. General surgery was consulted and felt he did not have an acute abdomen requiring  laparotomy.  On my interview, he was complaining of worsening abdominal pain and was writhing in the bed. He was asking for more pain medication to help control the pain. No other complaints aside from the abdominal pain. I also personally reviewed the CT-A with a radiologist who agreed with the final read that he did not have proximal mesenteric arterial disease or kidney stones.  He smokes 1ppd for 40 years. He drinks 2-3 beers on weekdays and up to 12 on weekends. He endorses occasional cocaine use (1-2x/month) and marijuana use. Last EtOH use last night, last cocaine 1 week ago, last marijuana last night.  Meds:  Albuterol inhaler PRN No outpatient prescriptions have been marked as taking for the 07/09/17 encounter [facility](Hospital Encounter).   Allergies: Allergies as of 07/09/2017 - Review Complete 07/09/2017  Allergen Reaction Noted  . Penicillins Other (See Comments) 07/21/2011   Past Medical History:  Diagnosis Date  . COPD (chronic obstructive pulmonary disease) (HCC)   . Emphysema   . ETOH abuse   . Pneumonia   . Polysubstance abuse    THC, Cocaine   Family History: Family History  Problem Relation Age of Onset  . Hypertension Mother   . Lung cancer Unknown    Social History:  - smokes 1ppd for 40 years - drinks 2-3 beers on weekdays and up to 12 on weekends. Last drink last night. - endorses occasional cocaine use (1-2x/month) and marijuana use. Last cocaine use 1 week ago, last marijuana use last night.  Review of Systems: Constitutional: Negative for diaphoresis, fever, malaise/fatigue, and weight loss. HEENT: Negative for blurred vision, hearing loss, sinus pain, congestion, and sore throat. Respiratory: Negative for cough and wheezing. Positive for some shortness of breath secondary to abdominal pain. However patient has O2 sats >95% on room air with no increased work of breathing. Cardiovascular: Negative for chest pain, palpitations, orthopnea, PND, and leg  swelling. Gastrointestinal: Negative for blood in stool, constipation, diarrhea, heartburn, nausea and vomiting. Positive for abdominal pain. Genitourinary: Negative for dysuria and hematuria. Musculoskeletal: Negative for joint pain and myalgias. Neurological: Negative for dizziness, focal weakness, weakness and headaches.  Physical Exam: Blood pressure 133/74, pulse 62, temperature 98.5 F (36.9 C), temperature source Oral, resp. rate 18, SpO2 94 %.   GEN: Well-nourished white male writhing uncomfortably in bed HENT: Moist mucous membranes. No visible lesions. EYES: EOMI. Sclera anicteric. PERRL. Pupils 3mm RESP: Clear to auscultation bilaterally. No wheezes, rales, or rhonchi. CV: Normal rate and regular rhythm. No murmurs, gallops, or rubs. No LE edema. ABD: Soft. Diffuse tenderness to palpation. Guarding. No rebound tenderness. Non-distended. Normoactive bowel sounds. EXT: No edema. Warm and well perfused. 1+ DP and posterior tibialis pulses. NEURO: Cranial nerves II-XII grossly intact. Able to lift all four extremities against gravity. PSYCH: Patient is pleasant, asking for pain medication. Writhing uncomfortably in bed. Appropriate affect. Speech is appropriate and on-subject.  Labs CBC    Component Value Date/Time   WBC 10.6 (H) 07/09/2017 0919   RBC 5.15 07/09/2017 0919   HGB 16.7 07/09/2017 0935   HCT 49.0 07/09/2017 0935   PLT 251 07/09/2017 0919   MCV 93.4 07/09/2017 0919   MCH 31.7 07/09/2017 0919   MCHC 33.9 07/09/2017 0919   RDW 13.2 07/09/2017 0919   LYMPHSABS 1.4 06/23/2015 0125   MONOABS 1.2 (H) 06/23/2015 0125   EOSABS 0.1 06/23/2015 0125   BASOSABS 0.1 06/23/2015 0125   CMP Latest Ref Rng & Units 07/09/2017 07/09/2017 03/15/2017  Glucose 65 - 99 mg/dL 161(W) 960(A) 95  BUN 6 - 20 mg/dL 9 9 9   Creatinine 0.61 - 1.24 mg/dL 5.40 9.81 1.91  Sodium 135 - 145 mmol/L 138 137 140  Potassium 3.5 - 5.1 mmol/L 4.1 4.4 4.0  Chloride 101 - 111 mmol/L 101 105 106  CO2  22 - 32 mmol/L - 23 24  Calcium 8.9 - 10.3 mg/dL - 9.2 9.5  Total Protein 6.5 - 8.1 g/dL - 6.3(L) -  Total Bilirubin 0.3 - 1.2 mg/dL - 0.6 -  Alkaline Phos 38 - 126 U/L - 85 -  AST 15 - 41 U/L - 20 -  ALT 17 - 63 U/L - 18 -   Lipase 93 UA neg UDS positive for cocaine and THC LDH 172 (nl)  EKG: NSR  RUQ U/S: No acute findings on this limited right upper quadrant ultrasound.  CT-A abd/pelvis: VASCULAR 1. Minimal aortoiliac arterial plaque. 2. No significant proximal mesenteric arterial occlusive disease.  NON-VASCULAR 1. Left inguinal hernia involving proximal sigmoid colon without obstruction or strangulation. 2. Additional nonacute findings including Stable left renal cyst Probable intrapulmonary lymph node in the lingula  Assessment & Plan by Problem: Active Problems:   Intractable abdominal pain  # Intractable abdominal pain Etiology: Acute mesenteric ischemia secondary to occlusive vasculature or cocaine use vs kidney stone vs non-physiologic pain - Acute onset of severe, sharp, intractable abdominal pain with no other associated abdominal symptoms. General surgery has already evaluated and does not believe he requires surgical intervention at this  time. - He does not endorse worsened pain with eating, no weight loss, and CT-A with normal mesenteric vasculature and minimal atherosclerotic disease. However, cocaine abuse has been implicated in mesenteric ischemia and gangrene due to cocaine-induced arterial vasospasm, leading to mucosal and transmural necrosis Abundio Miu et al., 2006). UDS is positive for cocaine. However, his LDH is normal, and he does not have an anion gap. He may be presenting with a very early presentation of mesenteric ischemia, so we will continue to monitor him and re-check CMET this evening. Benzodiazepines are the treatment of choice in cocaine-induced vasospasms, so we will trial this to see if it provides relief. - With his migratory waxing/waning  acute onset pain, another consideration is a kidney stone. However, CT-A with no evidence of hydronephrosis or nephrolithiasis and UA negative for RBCs, and he does not endorse flank pain/tenderness or hematuria, making this less likely as well.  - Another consideration is non-physiologic pain. On exam, he endorses that he is in very severe pain, is writhing in the bed, and continually asks for more pain medicine, however he does answer all of our questions calmly and without delay. [facility] review of the last year shows only one prescription of Percocet/Roxicet given in April 2018. UDS negative for opiates. At this point, we do not have a definitive reason for his abdominal pain but will continue to monitor him for signs of mesenteric ischemia. - Lipase only elevated to 93 with no evidence of pancreatitis on abdominal CT and without the classic symptoms. Does not meet criteria for acute pancreatitis. - CT-A without evidence of acute aortic dissection.  - CMP at 5pm to re-evaluate for acidosis - blood alcohol level - IV Ativan 1mg  PRN for abdominal pain - Advance diet as tolerated - Monitor for bloody diarrhea  # COPD O2 sats > 95% on RA in the hospital. - albuterol inhaler PRN - continue to monitor  # Alcohol use Last drink yesterday - CIWA protocol  # Tobacco use - Nicotine patch  VTE PPx: Lovenox Dispo: Admit patient to Observation with expected length of stay less than 2 midnights.  Signed: Scherrie Gerlach, MD  Internal Medicine, PGY-1 07/09/2017, 2:38 PM  P [phone]

## 2017-07-09 NOTE — ED Notes (Signed)
Patient states pain is as bad now as it was before when he first got to the ED and nausea returned. ED Doctor notified. Patient moving around in bed constantly IV left AC dislodged and 2x2 gauze placed.

## 2017-07-09 NOTE — Consult Note (Signed)
[facility] Surgery Consult Note  Alejandro Ferguson 12-14-1960  [id number].    Requesting MD: Tyrone Nine Chief Complaint/Reason for Consult: Abdominal pain  HPI:  Alejandro Ferguson is a 56yo male who presented to [facility] earlier this morning with acute onset abdominal pain. Pain began around 830 this morning while he was at work. States that it is constant and severe. States that the pain has moved around but is currently mostly in the epigastric region/LUQ. Describes the pain as sharp/stabbing. States that he has never had pain like this before. Denies nausea, vomiting, diarrhea, dysuria, fever, chills. He has had nothing to eat today. Uses NSAIDs occasionally. Admits to drinking 3-18 beers daily, smokes 1 PPD, and uses cocaine about 1-2x/month.  PMH significant for COPD Abdominal surgical history: open appendectomy Anticoagulants: none Smokes 1 PPD Drinks 3-18 beers daily, occasionally liquor Employment: works on houses Admits to using cocaine 1-2x per month  ROS: Review of Systems  Constitutional: Positive for diaphoresis.  HENT: Negative.   Eyes: Negative.   Respiratory: Negative.   Cardiovascular: Negative.   Gastrointestinal: Positive for abdominal pain. Negative for blood in stool, constipation, diarrhea, heartburn, melena, nausea and vomiting.  Genitourinary: Negative.   Musculoskeletal: Negative.   Skin: Negative.   Neurological: Negative.   All systems reviewed and otherwise negative except for as above  Family History  Problem Relation Age of Onset  . Hypertension Mother   . Lung cancer Unknown     Past Medical History:  Diagnosis Date  . COPD (chronic obstructive pulmonary disease) ([place])   . Emphysema   . ETOH abuse   . Pneumonia     Past Surgical History:  Procedure Laterality Date  . APPENDECTOMY    . CERVICAL DISC SURGERY    . left shoulder surgery      Social History:  reports that he has been smoking Cigarettes.  He has a 6.25 pack-year smoking history. He  has never used smokeless tobacco. He reports that he drinks about 18.0 oz of alcohol per week . He reports that he does not use drugs.  Allergies:  Allergies  Allergen Reactions  . Penicillins Other (See Comments)    Unknown childhood allergic reaction - no other information available     (Not in a hospital admission)  Prior to Admission medications   Medication Sig Start Date End Date Taking? Authorizing Provider  albuterol (PROVENTIL HFA;VENTOLIN HFA) 108 (90 Base) MCG/ACT inhaler Inhale 2 puffs into the lungs every 4 (four) hours as needed for wheezing or shortness of breath. 03/15/17   Esaw Grandchild, MD  diphenhydrAMINE (BENADRYL) 25 mg capsule Take 1 capsule (25 mg total) by mouth every 6 (six) hours as needed. Patient not taking: Reported on 03/15/2017 06/22/16   Shary Decamp, PA-C  gabapentin (NEURONTIN) 100 MG capsule Take 1 capsule (100 mg total) by mouth 3 (three) times daily. Patient not taking: Reported on 03/15/2017 06/22/16   Shary Decamp, PA-C  OVER THE COUNTER MEDICATION Take 1 tablet by mouth daily as needed (allergies). Over the counter allergy medication    [provider]  oxyCODONE-acetaminophen (PERCOCET/ROXICET) 5-325 MG tablet Take 1 tablet by mouth every 8 (eight) hours as needed for severe pain. Patient not taking: Reported on 03/15/2017 06/22/16   Shary Decamp, PA-C  oxyCODONE-acetaminophen (PERCOCET/ROXICET) 5-325 MG tablet Take 1 tablet by mouth every 4 (four) hours as needed for severe pain. 03/15/17   Esaw Grandchild, MD    Blood pressure (!) 146/103, pulse 89, temperature 98.5 F (36.9 C),  temperature source Oral, resp. rate (!) 23, SpO2 100 %. Physical Exam: General: WD/WN white male who is rolling in bed in acute distress HEENT: head is normocephalic, atraumatic.  Sclera are noninjected.  Pupils equal and round.  Ears and nose without any masses or lesions.  Mouth is pink and moist. Dentition fair Heart: regular, rate, and rhythm.  No obvious murmurs,  gallops, or rubs noted.  Palpable pedal pulses bilaterally Lungs: CTAB, no wheezes, rhonchi, or rales noted.  Respiratory effort nonlabored Abd: well healed RLQ incision, firm, ND, +BS, no masses, hernias, or organomegaly. Globally tender with most severe tenderness in epigastric/LUQ region with guarding. No rebound tenderness. MS: all 4 extremities are symmetrical with no cyanosis, clubbing, or edema. Skin: warm and dry with no masses, lesions, or rashes Psych: A&Ox3 with an appropriate affect. Neuro: cranial nerves grossly intact, extremity CSM intact bilaterally, normal speech  Results for orders placed or performed during the hospital encounter of 07/09/17 (from the past 48 hour(s))  Lipase, blood     Status: Abnormal   Collection Time: 07/09/17  9:19 AM  Result Value Ref Range   Lipase 93 (H) 11 - 51 U/L  Comprehensive metabolic panel     Status: Abnormal   Collection Time: 07/09/17  9:19 AM  Result Value Ref Range   Sodium 137 135 - 145 mmol/L   Potassium 4.4 3.5 - 5.1 mmol/L   Chloride 105 101 - 111 mmol/L   CO2 23 22 - 32 mmol/L   Glucose, Bld 136 (H) 65 - 99 mg/dL   BUN 9 6 - 20 mg/dL   Creatinine, Ser 0.95 0.61 - 1.24 mg/dL   Calcium 9.2 8.9 - 10.3 mg/dL   Total Protein 6.3 (L) 6.5 - 8.1 g/dL   Albumin 3.7 3.5 - 5.0 g/dL   AST 20 15 - 41 U/L   ALT 18 17 - 63 U/L   Alkaline Phosphatase 85 38 - 126 U/L   Total Bilirubin 0.6 0.3 - 1.2 mg/dL   GFR calc non Af Amer >60 >60 mL/min   GFR calc Af Amer >60 >60 mL/min    Comment: (NOTE) The eGFR has been calculated using the CKD EPI equation. This calculation has not been validated in all clinical situations. eGFR's persistently <60 mL/min signify possible Chronic Kidney Disease.    Anion gap 9 5 - 15  CBC     Status: Abnormal   Collection Time: 07/09/17  9:19 AM  Result Value Ref Range   WBC 10.6 (H) 4.0 - 10.5 K/uL   RBC 5.15 4.22 - 5.81 MIL/uL   Hemoglobin 16.3 13.0 - 17.0 g/dL   HCT 48.1 39.0 - 52.0 %   MCV 93.4 78.0  - 100.0 fL   MCH 31.7 26.0 - 34.0 pg   MCHC 33.9 30.0 - 36.0 g/dL   RDW 13.2 11.5 - 15.5 %   Platelets 251 150 - 400 K/uL  I-Stat Troponin, ED (not at [facility])     Status: None   Collection Time: 07/09/17  9:33 AM  Result Value Ref Range   Troponin i, poc 0.00 0.00 - 0.08 ng/mL   Comment 3            Comment: Due to the release kinetics of cTnI, a negative result within the first hours of the onset of symptoms does not rule out myocardial infarction with certainty. If myocardial infarction is still suspected, repeat the test at appropriate intervals.   I-Stat Chem 8, ED  Status: Abnormal   Collection Time: 07/09/17  9:35 AM  Result Value Ref Range   Sodium 138 135 - 145 mmol/L   Potassium 4.1 3.5 - 5.1 mmol/L   Chloride 101 101 - 111 mmol/L   BUN 9 6 - 20 mg/dL   Creatinine, Ser 0.90 0.61 - 1.24 mg/dL   Glucose, Bld 120 (H) 65 - 99 mg/dL   Calcium, Ion 1.17 1.15 - 1.40 mmol/L   TCO2 26 0 - 100 mmol/L   Hemoglobin 16.7 13.0 - 17.0 g/dL   HCT 49.0 39.0 - 52.0 %  Urinalysis, Routine w reflex microscopic     Status: Abnormal   Collection Time: 07/09/17 10:32 AM  Result Value Ref Range   Color, Urine STRAW (A) YELLOW   APPearance CLEAR CLEAR   Specific Gravity, Urine 1.011 1.005 - 1.030   pH 7.0 5.0 - 8.0   Glucose, UA NEGATIVE NEGATIVE mg/dL   Hgb urine dipstick NEGATIVE NEGATIVE   Bilirubin Urine NEGATIVE NEGATIVE   Ketones, ur NEGATIVE NEGATIVE mg/dL   Protein, ur NEGATIVE NEGATIVE mg/dL   Nitrite NEGATIVE NEGATIVE   Leukocytes, UA NEGATIVE NEGATIVE   Dg Chest 2 View  Result Date: 07/09/2017 CLINICAL DATA:  Central and right chest pain radiating to the back. Cough and shortness of breath. EXAM: CHEST  2 VIEW COMPARISON:  03/15/2017 FINDINGS: Severe emphysematous changes with large blebs at the left lung apex. No new airspace disease. No evidence for pulmonary edema. Heart and mediastinum are within normal limits. No large pleural effusions. Degenerative endplate changes in  thoracic spine. Negative for a pneumothorax. IMPRESSION: Emphysema without acute chest findings. Electronically Signed   By: Markus Daft M.D.   On: 07/09/2017 09:56   Ct Angio Abd/pel W/ And/or W/o  Result Date: 07/09/2017 CLINICAL DATA:  Abdominal rigidity and severe, diffuse, abdominal pain beginning about an hour ago. EXAM: CTA ABDOMEN AND PELVIS WITH CONTRAST TECHNIQUE: Multidetector CT imaging of the abdomen and pelvis was performed using the standard protocol during bolus administration of intravenous contrast. Multiplanar reconstructed images and MIPs were obtained and reviewed to evaluate the vascular anatomy. CONTRAST:  100 mL Isovue 370 IV COMPARISON:  None. FINDINGS: VASCULAR Aorta: Minimal calcified plaque in the infrarenal segment. No aneurysm, dissection, or stenosis. Celiac: Patent without evidence of aneurysm, dissection, vasculitis or significant stenosis. SMA: Patent without evidence of aneurysm, dissection, vasculitis or significant stenosis. Renals: Duplicated on the left, superior dominant, both widely patent. Single right, widely patent. IMA: Patent without evidence of aneurysm, dissection, vasculitis or significant stenosis. Inflow: Mild scattered calcified plaque. No aneurysm, dissection, or stenosis. Proximal Outflow: Bilateral common femoral and visualized portions of the superficial and profunda femoral arteries are patent without evidence of aneurysm, dissection, vasculitis or significant stenosis. Veins: Dedicated venous phase imaging not obtained. Review of the MIP images confirms the above findings. NON-VASCULAR Lower chest: Triangular 7 mm pleural-based nodule, inferior lingula image 5/7, present since 07/21/2011 suggesting benignity e.g. intrapulmonary lymph node. No pleural or pericardial effusion. Hepatobiliary: No focal liver abnormality is seen. No gallstones, gallbladder wall thickening, or biliary dilatation. Pancreas: Unremarkable. No pancreatic ductal dilatation or  surrounding inflammatory changes. Spleen: Normal in size without focal abnormality. Adrenals/Urinary Tract: Normal adrenals. Stable 3 cm upper pole left renal cyst. No hydronephrosis. Urinary bladder physiologically distended. Stomach/Bowel: Stomach, small bowel, and colon are nondilated. Appendix surgically absent. Proximal sigmoid colon protrudes into left inguinal hernia without obstruction or strangulation. Lymphatic: No abdominal or pelvic adenopathy localized. Reproductive: Prostatic enlargement. Enlarged lobular seminal  vesicles. No discrete mass. Other: No ascites.  No free air. Musculoskeletal: Left inguinal hernia involving proximal sigmoid colon without obstruction or strangulation. Small anterior endplate spurs throughout the lumbar spine. Benign bone island in the right femoral neck stable since 08/24/2011. Negative for fracture or worrisome bone lesion. IMPRESSION: VASCULAR 1. Minimal aortoiliac arterial plaque. 2. No significant proximal mesenteric arterial occlusive disease. NON-VASCULAR 1. Left inguinal hernia involving proximal sigmoid colon without obstruction or strangulation. 2. Additional nonacute findings including Stable left renal cyst Probable intrapulmonary lymph node in the lingula Electronically Signed   By: Lucrezia Europe M.D.   On: 07/09/2017 10:56   Anti-infectives    None        Assessment/Plan COPD Tobacco abuse Alcohol abuse Cocaine abuse  Acute abdominal pain - previous abdominal surgeries: appendectomy - acute onset severe upper abdominal pain  - CTA unremarkable - WBC 10.6, afebrile - lipase 93, LFTs WNL  Plan - Patient continues to be in severe pain with unknown etiology, although he does not have an acute surgical abdomen. His lipase is elevated so this could be from pancreatitis. Gallbladder was unremarkable on CT but will order u/s for better evaluation. Patient does drink beer heavily and smoke so he could have an ulcer. Just received GI cocktail; will  monitor and see if this helps.  Will discuss with MD.  Wellington Hampshire, [facility] Surgery 07/09/2017, 11:42 AM Pager: [phone] Consults: [phone] Mon-Fri 7:00 am-4:30 pm Sat-Sun 7:00 am-11:30 am

## 2017-07-09 NOTE — ED Triage Notes (Signed)
Pt reports onset approx 1 hour ago of severe lower abd pain and mid chest pain with sob. Has nausea, no vomiting. Appears very uncomfortable at triage.

## 2017-07-09 NOTE — ED Notes (Signed)
Ct called x 2 about concern for patient and to please get him as soon as possible

## 2017-07-10 LAB — HIV ANTIBODY (ROUTINE TESTING W REFLEX): HIV Screen 4th Generation wRfx: NONREACTIVE

## 2017-07-10 MED ORDER — NICOTINE 21 MG/24HR TD PT24
21.0000 mg | MEDICATED_PATCH | TRANSDERMAL | Status: DC
  Administered 2017-07-10: 21 mg via TRANSDERMAL
  Filled 2017-07-10: qty 1

## 2017-07-10 NOTE — Progress Notes (Signed)
<principal problem not specified>  Subjective: Pain about the same today.  Mainly in R and LLQ's  Objective: Vital signs in last 24 hours: Temp:  [98.2 F (36.8 C)-98.6 F (37 C)] 98.2 F (36.8 C) (08/11 0410) Pulse Rate:  [53-94] 66 (08/11 0410) Resp:  [16-20] 18 (08/11 0410) BP: (92-135)/(54-95) 113/74 (08/11 0410) SpO2:  [90 %-98 %] 92 % (08/11 0410) Last BM Date: 07/07/17  Intake/Output from previous day: No intake/output data recorded. Intake/Output this shift: No intake/output data recorded.  General appearance: alert and cooperative GI: normal findings: soft, nondistended, nontender to palpation  Lab Results:  Results for orders placed or performed during the hospital encounter of 07/09/17 (from the past 24 hour(s))  Urinalysis, Routine w reflex microscopic     Status: Abnormal   Collection Time: 07/09/17 10:32 AM  Result Value Ref Range   Color, Urine STRAW (A) YELLOW   APPearance CLEAR CLEAR   Specific Gravity, Urine 1.011 1.005 - 1.030   pH 7.0 5.0 - 8.0   Glucose, UA NEGATIVE NEGATIVE mg/dL   Hgb urine dipstick NEGATIVE NEGATIVE   Bilirubin Urine NEGATIVE NEGATIVE   Ketones, ur NEGATIVE NEGATIVE mg/dL   Protein, ur NEGATIVE NEGATIVE mg/dL   Nitrite NEGATIVE NEGATIVE   Leukocytes, UA NEGATIVE NEGATIVE  Urine rapid drug screen (hosp performed)     Status: Abnormal   Collection Time: 07/09/17  1:20 PM  Result Value Ref Range   Opiates NONE DETECTED NONE DETECTED   Cocaine POSITIVE (A) NONE DETECTED   Benzodiazepines NONE DETECTED NONE DETECTED   Amphetamines NONE DETECTED NONE DETECTED   Tetrahydrocannabinol POSITIVE (A) NONE DETECTED   Barbiturates NONE DETECTED NONE DETECTED  Lactate dehydrogenase     Status: None   Collection Time: 07/09/17  2:20 PM  Result Value Ref Range   LDH 172 98 - 192 U/L  Ethanol     Status: None   Collection Time: 07/09/17  6:13 PM  Result Value Ref Range   Alcohol, Ethyl (B) <5 <5 mg/dL  Comprehensive metabolic panel      Status: Abnormal   Collection Time: 07/09/17  6:13 PM  Result Value Ref Range   Sodium 137 135 - 145 mmol/L   Potassium 4.0 3.5 - 5.1 mmol/L   Chloride 101 101 - 111 mmol/L   CO2 25 22 - 32 mmol/L   Glucose, Bld 103 (H) 65 - 99 mg/dL   BUN 6 6 - 20 mg/dL   Creatinine, Ser 4.090.84 0.61 - 1.24 mg/dL   Calcium 9.3 8.9 - 81.110.3 mg/dL   Total Protein 7.3 6.5 - 8.1 g/dL   Albumin 4.2 3.5 - 5.0 g/dL   AST 22 15 - 41 U/L   ALT 18 17 - 63 U/L   Alkaline Phosphatase 89 38 - 126 U/L   Total Bilirubin 0.8 0.3 - 1.2 mg/dL   GFR calc non Af Amer >60 >60 mL/min   GFR calc Af Amer >60 >60 mL/min   Anion gap 11 5 - 15  HIV antibody (Routine Testing)     Status: None   Collection Time: 07/09/17  6:13 PM  Result Value Ref Range   HIV Screen 4th Generation wRfx Non Reactive Non Reactive     Studies/Results Radiology     MEDS, Scheduled . enoxaparin (LOVENOX) injection  40 mg Subcutaneous Q24H  . folic acid  1 mg Oral Daily  . multivitamin with minerals  1 tablet Oral Daily  . nicotine  14 mg Transdermal Daily  .  senna-docusate  2 tablet Oral QHS  . sodium chloride flush  3 mL Intravenous Q12H  . thiamine  100 mg Oral Daily   Or  . thiamine  100 mg Intravenous Daily     Assessment: <principal problem not specified> abd pain  Plan: Does not appear to have a surgical cause for his pain  LOS: 0 days    Vanita Panda, MD [facility], [facility], [place] [phone]   07/10/2017 10:01 AM

## 2017-07-10 NOTE — Final Consult Note (Signed)
Consultant Final Sign-Off Note    Assessment/Final recommendations  Alejandro MarylandRandy M Ferguson is a 56 y.o. male followed by [facility] for abd pain   Wound care (if applicable):    Diet at discharge: per primary team   Activity at discharge: per primary team   Follow-up appointment:   PRN  Pending results:  Unresulted Labs    Start     Ordered   07/16/17 0500  Creatinine, serum  (enoxaparin (LOVENOX)    CrCl >/= 30 ml/min)  Weekly,   R    Comments:  while on enoxaparin therapy    07/09/17 1722       Medication recommendations:   Other recommendations:    Thank you for allowing us to participate in the care of your patient!  Please consult us again if you have further needs for your patient.  Alejandro Ferguson C. 07/10/2017 10:03 AM

## 2017-07-10 NOTE — Discharge Summary (Signed)
Name: Alejandro Ferguson MRN: [id number] DOB: 10/31/1961 56 y.o. PCP: Patient, No Pcp Per  Date of Admission: 07/09/2017  9:17 AM Date of Discharge: 07/11/2017 Attending Physician: Levert FeinsteinGranfortuna, James M, MD  Discharge Diagnosis: 1. Intractable abdominal pain   Discharge Medications: Allergies as of 07/11/2017      Reactions   Penicillins Other (See Comments)   Unknown childhood allergic reaction - no other information available      Medication List    STOP taking these medications   albuterol 108 (90 Base) MCG/ACT inhaler Commonly known as:  PROVENTIL HFA;VENTOLIN HFA   diphenhydrAMINE 25 mg capsule Commonly known as:  BENADRYL   gabapentin 100 MG capsule Commonly known as:  NEURONTIN   oxyCODONE-acetaminophen 5-325 MG tablet Commonly known as:  PERCOCET/ROXICET Replaced by:  oxyCODONE-acetaminophen 7.5-325 MG tablet     TAKE these medications   docusate sodium 100 MG capsule Commonly known as:  COLACE Take 1 capsule (100 mg total) by mouth 2 (two) times daily.   ondansetron 4 MG tablet Commonly known as:  ZOFRAN Take 1 tablet (4 mg total) by mouth every 8 (eight) hours as needed for nausea or vomiting.   oxyCODONE-acetaminophen 7.5-325 MG tablet Commonly known as:  PERCOCET Take 1 tablet by mouth every 6 (six) hours as needed for severe pain. Replaces:  oxyCODONE-acetaminophen 5-325 MG tablet       Disposition and follow-up:   Alejandro Ferguson was [facility] in Stable condition.  At the hospital follow up visit please address:  1.   - How is his abdominal pain? Any N/V? - Any bloody diarrhea, blood in stools, or hematemesis?  2.  Labs / imaging needed at time of follow-up: None  3.  Pending labs/ test needing follow-up: None  Follow-up Appointments: Follow-up Information    [facility]. Schedule an appointment as soon as possible for a visit in 1 week(s).   Contact information: [address] [place] [phone]          Hospital Course by problem list: Active Problems:   Intractable abdominal pain   1. Intractable abdominal pain, unknown etiology Patient presented on 8/10 with acute onset of shortness of breath followed by severe, sharp, intractable abdominal pain with no other associated abdominal symptoms. Initial concern was for acute aortic dissection. CT-A of abdomen/pelvis was negative for aortic dissection, hydronephrosis, nephrolithiasis, atherosclerotic disease, or occluded mesenteric vasculature. EKG with NSR with sinus arrhythmia. CXR and RUQ U/S negative for acute findings. UDS was positive for cocaine and THC. Troponin, blood alcohol level, UA, LDH, LFTs, and BMET were normal. Lipase minimally elevated. He was treated with dilaudid and benzodiazepines with some temporary relief of his pain. His pain improved slowly but he was still having intermittent episodes of pain, controlled with Oxycodone. He was discharged in stable condition with prescription for 5 days of Oxycodone with [facility].  Discharge Vitals:   BP (!) 137/93 (BP Location: Right Arm)   Pulse 81   Temp 98.3 F (36.8 C) (Oral)   Resp 18   Ht 6\' 1"  (1.854 m)   Wt 177 lb 12.8 oz (80.6 kg)   SpO2 93%   BMI 23.46 kg/m   Pertinent Labs, Studies, and Procedures:  CBC    Component Value Date/Time   WBC 10.6 (H) 07/09/2017 0919   RBC 5.15 07/09/2017 0919   HGB 16.7 07/09/2017 0935  HCT 49.0 07/09/2017 0935   PLT 251 07/09/2017 0919   MCV 93.4 07/09/2017 0919   MCH 31.7 07/09/2017 0919   MCHC 33.9 07/09/2017 0919   RDW 13.2 07/09/2017 0919   LYMPHSABS 1.4 06/23/2015 0125   MONOABS 1.2 (H) 06/23/2015 0125   EOSABS 0.1 06/23/2015 0125   BASOSABS 0.1 06/23/2015 0125   CMP Latest Ref Rng & Units 07/09/2017 07/09/2017 07/09/2017  Glucose 65 - 99 mg/dL 161(W) 960(A) 540(J)  BUN 6 - 20 mg/dL 6 9 9   Creatinine 0.61 - 1.24  mg/dL 8.11 9.14 7.82  Sodium 135 - 145 mmol/L 137 138 137  Potassium 3.5 - 5.1 mmol/L 4.0 4.1 4.4  Chloride 101 - 111 mmol/L 101 101 105  CO2 22 - 32 mmol/L 25 - 23  Calcium 8.9 - 10.3 mg/dL 9.3 - 9.2  Total Protein 6.5 - 8.1 g/dL 7.3 - 6.3(L)  Total Bilirubin 0.3 - 1.2 mg/dL 0.8 - 0.6  Alkaline Phos 38 - 126 U/L 89 - 85  AST 15 - 41 U/L 22 - 20  ALT 17 - 63 U/L 18 - 18   LDH 172 Lipase 93 Troponin 0.00 HIV nonreactive BAL <5 UDS positive for cocaine and THC UA neg for infection  CXR 8/10 Emphysema without acute chest findings.  RUQ U/S 8/10 No acute findings on this limited right upper quadrant ultrasound.  CT-A Abd/Pel 8/10 VASCULAR 1. Minimal aortoiliac arterial plaque. 2. No significant proximal mesenteric arterial occlusive disease.  NON-VASCULAR 1. Left inguinal hernia involving proximal sigmoid colon without obstruction or strangulation. 2. Additional nonacute findings including Stable left renal cyst Probable intrapulmonary lymph node in the lingula  Discharge Instructions: Discharge Instructions    Diet - low sodium heart healthy    Complete by:  As directed    Increase activity slowly    Complete by:  As directed       Signed: Valentino Nose, MD 07/15/2017, 6:34 AM   P [phone]

## 2017-07-10 NOTE — Progress Notes (Signed)
Subjective: No acute events overnight. Continues to have severe pain in abdomen, worst this morning in RLQ but also tender in LLQ. No other abdominal complaints. No bloody diarrhea, hematemesis or blood in stools.  Is feeling hungry. Will try soft foods today to see how he tolerates it.  Objective:  Vital signs in last 24 hours: Vitals:   07/09/17 1630 07/09/17 1800 07/09/17 2200 07/10/17 0410  BP: 106/68 122/87 135/77 113/74  Pulse: (!) 58 (!) 58 60 66  Resp:  16 18 18   Temp:  98.4 F (36.9 C) 98.6 F (37 C) 98.2 F (36.8 C)  TempSrc:  Oral Oral Oral  SpO2: 91% 98% 97% 92%   GEN: Well-appearing, white male sitting comfortably in bed in NAD. Alert and oriented. EYES: EOMI. Sclera anicteric. PERRL. RESP: Clear to auscultation bilaterally. Mild inspiratory wheezes in lower lung fields. No crackles. CV: Normal rate and regular rhythm. No murmurs, gallops, or rubs. No LE edema. ABD: Soft. Non-distended. Normoactive bowel sounds. Diffuse tenderness to palpation, worst in RLQ. EXT: No edema. 1+ L DP and 2+ R DP pulses.  Labs CMP Latest Ref Rng & Units 07/09/2017 07/09/2017 07/09/2017  Glucose 65 - 99 mg/dL 960(A) 540(J) 811(B)  BUN 6 - 20 mg/dL 6 9 9   Creatinine 0.61 - 1.24 mg/dL 1.47 8.29 5.62  Sodium 135 - 145 mmol/L 137 138 137  Potassium 3.5 - 5.1 mmol/L 4.0 4.1 4.4  Chloride 101 - 111 mmol/L 101 101 105  CO2 22 - 32 mmol/L 25 - 23  Calcium 8.9 - 10.3 mg/dL 9.3 - 9.2  Total Protein 6.5 - 8.1 g/dL 7.3 - 6.3(L)  Total Bilirubin 0.3 - 1.2 mg/dL 0.8 - 0.6  Alkaline Phos 38 - 126 U/L 89 - 85  AST 15 - 41 U/L 22 - 20  ALT 17 - 63 U/L 18 - 18   BAL <5  Assessment/Plan:  Active Problems:   Intractable abdominal pain  Alejandro Ferguson is a 56yo male with PMH significant for COPD, EtOH abuse, and substance use (including cocaine and marijuana) who presents on 8/10 with acute onset of severe, sharp abdominal pain. He continues to have abdominal pain today with no other GI  complaints.  # Intractable abdominal pain Etiology: Mesenteric vasospasms secondary to cocaine use vs ingestion of unknown substance in drugs At this point, we still do not have a clear etiology of what is causing his abdominal pain. All of his labs and imaging have been negative, but he continues to have severe pain. He may have used a drug laced with an unknown substance that is now causing the abdominal pain. Or this may be vasospasms secondary to cocaine use. However, he states that he last used cocaine a week ago and I am not sure whether he would continue to have such severe pain after a few days. General surgery has been consulted and they do not feel he has an acute abdomen requiring surgery at this time. The patient is asking for food, so we will continue to monitor him to ensure that he is able to tolerate food and for improvement in his abdominal pain. - IV dilaudid PRN and PO percocet PRN for abdominal pain - Advance diet as tolerated - Monitor for bloody diarrhea  # COPD O2 sats > 95% on RA in the hospital. - albuterol inhaler PRN - continue to monitor  # Alcohol use - CIWA protocol  # Tobacco use - Nicotine patch  VTE PPx: Lovenox  Dispo: Anticipated  discharge in approximately 1 day(s).   Scherrie GerlachHuang, Alejandro Ruelas, MD  Internal Medicine, PGY-1 07/10/2017, 10:16 AM P [phone]

## 2017-07-11 MED ORDER — OXYCODONE-ACETAMINOPHEN 7.5-325 MG PO TABS: 1.0000 | ORAL_TABLET | Freq: Four times a day (QID) | ORAL | 0 refills | Status: DC | PRN

## 2017-07-11 MED ORDER — ONDANSETRON HCL 4 MG PO TABS: 4.0000 mg | ORAL_TABLET | Freq: Three times a day (TID) | ORAL | 0 refills | Status: AC | PRN

## 2017-07-11 MED ORDER — DOCUSATE SODIUM 100 MG PO CAPS: 100.0000 mg | ORAL_CAPSULE | Freq: Two times a day (BID) | ORAL | 0 refills | Status: AC

## 2017-07-11 NOTE — Progress Notes (Signed)
   Subjective:  [name] is sitting up eating breakfast this morning. He reports no issues with eating. Denies any pain with meals. He does reports some nausea yesterday but no emesis. Denies any bowel movments since admission. He reports the pain is well controlled while on the pain medications but unchanged. However, it returns after 2-3 hours after his last dose. Does report improvement with the Percocet. He would like to go home today.  He does not have a regular physician that he sees. His wife reports going to [facility] and would like him to follow there.   Objective:  Vital signs in last 24 hours: Vitals:   07/10/17 0410 07/10/17 1512 07/10/17 1948 07/11/17 0412  BP: 113/74 118/83 126/84 (!) 137/93  Pulse: 66 83 70 81  Resp: 18 18    Temp: 98.2 F (36.8 C) 97.7 F (36.5 C) 97.6 F (36.4 C) 98.3 F (36.8 C)  TempSrc: Oral Oral Axillary Oral  SpO2: 92% 96% 94% 93%  Weight:    177 lb 12.8 oz (80.6 kg)  Height:    6\' 1"  (1.854 m)   GENERAL- alert, co-operative, appears as stated age, not in any distress. CARDIAC- RRR, no murmurs, rubs or gallops. RESP- CTAB ABDOMEN- Soft, diffusely tender to palpation; worst in LLQ, no guarding or rebound tenderness, bowel sounds present. SKIN- Warm, dry, No rash or lesion.  Assessment/Plan:  Intractable abdominal pain Work up has been unrevealing and etiology remains unclear. He has resumed a normal diet has tolerating it well. Pain continues to improve but still reporting intermittent episodes requiring does of opioids. Pain may be secondary to ingestion of unknown substance contaminated in his marijuana or cocaine. Discussed avoidance of these drugs in the future. Overall, appears clinically stable and anticipate discharge today. Will discharge with 5 day course of pain management to follow up with [facility].   Alcohol use - CIWA protocol  Tobacco use - Nicotine patch  Dispo: Anticipated  discharge today  Valentino NoseBoswell, Alejandro Felicetti, MD 07/11/2017, 7:34 AM Pager: [phone]

## 2017-07-11 NOTE — Progress Notes (Signed)
Pt was d/c'd per md orders. Pts iv was removed, site was CDI. Pt was given scripts and AVS. Information was discvussed and questions were answered. Pt was stable leaving the floor. Pt left via foot with friends to private vehicle.

## 2017-07-15 ENCOUNTER — Emergency Department (HOSPITAL_COMMUNITY): Payer: Self-pay

## 2017-07-15 ENCOUNTER — Encounter (HOSPITAL_COMMUNITY): Payer: Self-pay | Admitting: Emergency Medicine

## 2017-07-15 ENCOUNTER — Emergency Department (HOSPITAL_COMMUNITY)
Admission: EM | Admit: 2017-07-15 | Discharge: 2017-07-16 | Disposition: A | Discharge status: Home / Self Care | Payer: Self-pay | Attending: Emergency Medicine | Admitting: Emergency Medicine

## 2017-07-15 DIAGNOSIS — R1013 Epigastric pain: Secondary | ICD-10-CM | POA: Insufficient documentation

## 2017-07-15 DIAGNOSIS — F17210 Nicotine dependence, cigarettes, uncomplicated: Secondary | ICD-10-CM | POA: Insufficient documentation

## 2017-07-15 DIAGNOSIS — R1084 Generalized abdominal pain: Secondary | ICD-10-CM

## 2017-07-15 DIAGNOSIS — J449 Chronic obstructive pulmonary disease, unspecified: Secondary | ICD-10-CM | POA: Insufficient documentation

## 2017-07-15 LAB — CBC
HCT: 47.4 % (ref 39.0–52.0)
Hemoglobin: 17 g/dL (ref 13.0–17.0)
MCH: 33.1 pg (ref 26.0–34.0)
MCHC: 35.9 g/dL (ref 30.0–36.0)
MCV: 92.4 fL (ref 78.0–100.0)
PLATELETS: 289 10*3/uL (ref 150–400)
RBC: 5.13 MIL/uL (ref 4.22–5.81)
RDW: 13.3 % (ref 11.5–15.5)
WBC: 15.1 10*3/uL — AB (ref 4.0–10.5)

## 2017-07-15 LAB — COMPREHENSIVE METABOLIC PANEL
ALT: 22 U/L (ref 17–63)
ANION GAP: 9 (ref 5–15)
AST: 24 U/L (ref 15–41)
Albumin: 4.1 g/dL (ref 3.5–5.0)
Alkaline Phosphatase: 76 U/L (ref 38–126)
BUN: 14 mg/dL (ref 6–20)
CHLORIDE: 106 mmol/L (ref 101–111)
CO2: 24 mmol/L (ref 22–32)
Calcium: 9.7 mg/dL (ref 8.9–10.3)
Creatinine, Ser: 0.99 mg/dL (ref 0.61–1.24)
Glucose, Bld: 105 mg/dL — ABNORMAL HIGH (ref 65–99)
POTASSIUM: 3.6 mmol/L (ref 3.5–5.1)
Sodium: 139 mmol/L (ref 135–145)
TOTAL PROTEIN: 7.1 g/dL (ref 6.5–8.1)
Total Bilirubin: 0.8 mg/dL (ref 0.3–1.2)

## 2017-07-15 LAB — URINALYSIS, ROUTINE W REFLEX MICROSCOPIC
BILIRUBIN URINE: NEGATIVE
Glucose, UA: NEGATIVE mg/dL
HGB URINE DIPSTICK: NEGATIVE
Ketones, ur: NEGATIVE mg/dL
Leukocytes, UA: NEGATIVE
Nitrite: NEGATIVE
Protein, ur: NEGATIVE mg/dL
Specific Gravity, Urine: 1.014 (ref 1.005–1.030)
pH: 6 (ref 5.0–8.0)

## 2017-07-15 LAB — LIPASE, BLOOD: LIPASE: 24 U/L (ref 11–51)

## 2017-07-15 LAB — RAPID URINE DRUG SCREEN, HOSP PERFORMED
AMPHETAMINES: NOT DETECTED
Barbiturates: NOT DETECTED
Benzodiazepines: NOT DETECTED
Cocaine: POSITIVE — AB
OPIATES: NOT DETECTED
TETRAHYDROCANNABINOL: POSITIVE — AB

## 2017-07-15 LAB — POCT I-STAT TROPONIN I: Troponin i, poc: 0 ng/mL (ref 0.00–0.08)

## 2017-07-15 LAB — ETHANOL

## 2017-07-15 LAB — CG4 I-STAT (LACTIC ACID): LACTIC ACID, VENOUS: 2.36 mmol/L — AB (ref 0.5–1.9)

## 2017-07-15 MED ORDER — SODIUM CHLORIDE 0.9 % IV BOLUS (SEPSIS)
1000.0000 mL | Freq: Once | INTRAVENOUS | Status: AC
  Administered 2017-07-15: 1000 mL via INTRAVENOUS

## 2017-07-15 MED ORDER — HYDROMORPHONE HCL 1 MG/ML IJ SOLN
1.0000 mg | Freq: Once | INTRAMUSCULAR | Status: AC
  Administered 2017-07-15: 1 mg via INTRAVENOUS
  Filled 2017-07-15: qty 1

## 2017-07-15 MED ORDER — ONDANSETRON HCL 4 MG/2ML IJ SOLN
4.0000 mg | Freq: Once | INTRAMUSCULAR | Status: AC
  Administered 2017-07-15: 4 mg via INTRAVENOUS
  Filled 2017-07-15: qty 2

## 2017-07-15 MED ORDER — IOPAMIDOL (ISOVUE-300) INJECTION 61%
INTRAVENOUS | Status: AC
  Filled 2017-07-15: qty 100

## 2017-07-15 MED ORDER — IOPAMIDOL (ISOVUE-300) INJECTION 61%
100.0000 mL | Freq: Once | INTRAVENOUS | Status: AC | PRN
  Administered 2017-07-15: 100 mL via INTRAVENOUS

## 2017-07-15 NOTE — ED Notes (Signed)
Pt has a lactic acid of 2.36 EDP Mabe made aware.

## 2017-07-15 NOTE — ED Provider Notes (Signed)
[facility]-EMERGENCY DEPT Provider Note   CSN: [id number] Arrival date & time: 07/15/17  2036     History   Chief Complaint Chief Complaint  Patient presents with  . Chest Pain  . Abdominal Pain    HPI Alejandro Ferguson is a 56 y.o. male.  HPI  Pt with hx of anxiety, copd, substance abuse presenting with c/o abdominal pain.  He was admitted at [facility] and discharged approx 6 days ago for similar pain.  Workup at that time revealed no specific cause for his pain.  IV pain medications helped pain temporarily.  He states that since discharge he has not used all of oxycodone- pain became worse again 2 days ago.  Worse after eating.  No fever/chills.  No vomiting.  There are no other associated systemic symptoms, there are no other alleviating or modifying factors.   Past Medical History:  Diagnosis Date  . Anxiety   . COPD (chronic obstructive pulmonary disease) (HCC)   . Emphysema   . ETOH abuse   . Pneumonia    "several times" (07/09/2017)  . Polysubstance abuse    THC, Cocaine  . Tuberculosis    "when I was little" (07/09/2017)    Patient Active Problem List   Diagnosis Date Noted  . Intractable abdominal pain 07/09/2017  . Pneumonia 08/25/2011  . Tobacco abuse 08/25/2011  . Hip pain, right 08/25/2011    Past Surgical History:  Procedure Laterality Date  . ANTERIOR CERVICAL DECOMP/DISCECTOMY FUSION    . APPENDECTOMY    . BACK SURGERY    . INGUINAL HERNIA REPAIR Left   . ORBITAL FRACTURE SURGERY Right    "I've got a plate over my right eye"  . SHOULDER ARTHROSCOPY WITH ROTATOR CUFF REPAIR Left   . ULNAR NERVE REPAIR Right    "@ elbow"       Home Medications    Prior to Admission medications   Medication Sig Start Date End Date Taking? Authorizing Provider  ondansetron (ZOFRAN) 4 MG tablet Take 1 tablet (4 mg total) by mouth every 8 (eight) hours as needed for nausea or vomiting. 07/11/17 07/11/18 Yes Valentino Nose, MD  oxyCODONE-acetaminophen (PERCOCET) 7.5-325 MG  tablet Take 1 tablet by mouth every 6 (six) hours as needed for severe pain. 07/11/17  Yes Valentino Nose, MD  polyethylene glycol (MIRALAX / GLYCOLAX) packet Take 17 g by mouth daily as needed for mild constipation.   Yes [provider]  docusate sodium (COLACE) 100 MG capsule Take 1 capsule (100 mg total) by mouth 2 (two) times daily. 07/11/17 07/11/18  Valentino Nose, MD    Family History Family History  Problem Relation Age of Onset  . Hypertension Mother   . Lung cancer Unknown     Social History Social History  Substance Use Topics  . Smoking status: Current Every Day Smoker    Packs/day: 1.00    Years: 38.00    Types: Cigarettes  . Smokeless tobacco: Never Used  . Alcohol use 10.8 oz/week    18 Cans of beer per week     Comment: 07/09/2017 "maybe 18/ beersweek on average"     Allergies   Penicillins   Review of Systems Review of Systems  ROS reviewed and all otherwise negative except for mentioned in HPI   Physical Exam Updated Vital Signs BP (!) 141/99   Pulse 88   Temp 99.7 F (37.6 C) (Oral)   Resp (!) 22   Ht 6' (1.829 m)   Wt 81.6 kg (  180 lb)   SpO2 93%   BMI 24.41 kg/m  Vitals reivewed Physical Exam  Physical Examination: General appearance - alert, uncomfortable appearing, and in no distress Mental status - alert, oriented to person, place, and time Eyes - no conjunctival injection, no scleral icterus Mouth - mucous membranes moist, pharynx normal without lesions Neck - supple, no significant adenopathy Chest - clear to auscultation, no wheezes, rales or rhonchi, symmetric air entry Heart - normal rate, regular rhythm, normal S1, S2, no murmurs, rubs, clicks or gallops Abdomen - soft, nontender, nondistended, no masses or organomegaly Neurological - alert, orientedx  3, anxious appearing, strength 5/5 in extremities x4 Extremities - peripheral pulses normal, no pedal edema, no clubbing or cyanosis Skin - normal coloration and turgor, no  rashes   ED Treatments / Results  Labs (all labs ordered are listed, but only abnormal results are displayed) Labs Reviewed  CBC - Abnormal; Notable for the following:       Result Value   WBC 15.1 (*)    All other components within normal limits  COMPREHENSIVE METABOLIC PANEL - Abnormal; Notable for the following:    Glucose, Bld 105 (*)    All other components within normal limits  RAPID URINE DRUG SCREEN, HOSP PERFORMED - Abnormal; Notable for the following:    Cocaine POSITIVE (*)    Tetrahydrocannabinol POSITIVE (*)    All other components within normal limits  CG4 I-STAT (LACTIC ACID) - Abnormal; Notable for the following:    Lactic Acid, Venous 2.36 (*)    All other components within normal limits  LIPASE, BLOOD  ETHANOL  URINALYSIS, ROUTINE W REFLEX MICROSCOPIC  I-STAT TROPONIN, ED  I-STAT CG4 LACTIC ACID, ED  POCT I-STAT TROPONIN I    EKG  EKG Interpretation None       Radiology Dg Chest 2 View  Result Date: 07/15/2017 CLINICAL DATA:  56 y/o  M; 3 days of chest and abdominal pain. EXAM: CHEST  2 VIEW COMPARISON:  07/09/2017 chest radiograph FINDINGS: Biapical bullous changes and emphysema. No focal consolidation. No pleural effusion or pneumothorax. Stable cardiac silhouette. Mild levocurvature of the thoracic spine. IMPRESSION: Bullous emphysema greatest in upper lobes. No acute pulmonary process identified. Electronically Signed   By: Mitzi Hansen M.D.   On: 07/15/2017 21:05   Ct Abdomen Pelvis W Contrast  Result Date: 07/15/2017 CLINICAL DATA:  Central chest pain radiating to the right with generalized abdominal pain for 1 week EXAM: CT ABDOMEN AND PELVIS WITH CONTRAST TECHNIQUE: Multidetector CT imaging of the abdomen and pelvis was performed using the standard protocol following bolus administration of intravenous contrast. CONTRAST:  ISOVUE-300 IOPAMIDOL (ISOVUE-300) INJECTION 61% COMPARISON:  Ultrasound 07/09/2017, CT 07/09/2017, CT  05/13/2016 FINDINGS: Lower chest: No acute abnormality. Tiny nodules in the lingula are unchanged. Hepatobiliary: No focal liver abnormality is seen. No gallstones, gallbladder wall thickening, or biliary dilatation. Pancreas: Unremarkable. No pancreatic ductal dilatation or surrounding inflammatory changes. Spleen: Normal in size without focal abnormality. Adrenals/Urinary Tract: Adrenal glands are within normal limits. Cyst in the midpole of left kidney. Bladder unremarkable. Stomach/Bowel: Stomach is within normal limits. Status post appendectomy. No evidence of bowel wall thickening, distention, or inflammatory changes. Vascular/Lymphatic: Aortic atherosclerosis. No enlarged abdominal or pelvic lymph nodes. Reproductive: Prostate is unremarkable. Other: Negative for free air or free fluid. Fat in the left greater than right inguinal canal. Negative for free air or free fluid. Musculoskeletal: Degenerative changes. No acute or suspicious bone lesion IMPRESSION: 1. No CT evidence  for acute intra-abdominal or pelvic pathology 2. Stable nodules in the lingula, likely benign 3. Stable cyst in the left kidney Electronically Signed   By: Jasmine PangKim  Fujinaga M.D.   On: 07/15/2017 23:15    Procedures Procedures (including critical care time)  Medications Ordered in ED Medications  iopamidol (ISOVUE-300) 61 % injection (not administered)  sodium chloride 0.9 % bolus 1,000 mL (1,000 mLs Intravenous New Bag/Given 07/15/17 2354)  gi cocktail (Maalox,Lidocaine,Donnatal) (not administered)  HYDROmorphone (DILAUDID) injection 1 mg (1 mg Intravenous Given 07/15/17 2231)  ondansetron (ZOFRAN) injection 4 mg (4 mg Intravenous Given 07/15/17 2231)  sodium chloride 0.9 % bolus 1,000 mL (0 mLs Intravenous Stopped 07/15/17 2354)  iopamidol (ISOVUE-300) 61 % injection 100 mL (100 mLs Intravenous Contrast Given 07/15/17 2247)  HYDROmorphone (DILAUDID) injection 1 mg (1 mg Intravenous Given 07/15/17 2354)     Initial Impression /  Assessment and Plan / ED Course  I have reviewed the triage vital signs and the nursing notes.  Pertinent labs & imaging results that were available during my care of the patient were reviewed by me and considered in my medical decision making (see chart for details).    11:57 PM pt felt improved after first dose of dilaudid down to 0/10- however pain is recurring now.  Will give second dose of dialudid,  Continue IV fluids and reassess.   Pt signed out to Dr. Adela LankFloyd to continue with pain management and reassess.  Discussed results with patient and family at bedside.   Final Clinical Impressions(s) / ED Diagnoses   Final diagnoses:  Generalized abdominal pain    New Prescriptions New Prescriptions   No medications on file     Phillis HaggisMabe, Edwardine Deschepper L, MD 07/16/17 [phone]

## 2017-07-15 NOTE — ED Notes (Signed)
Pt aware that a urine sample is needed. Pt given a urinal and ask to press call bell when he has a sample ready.

## 2017-07-15 NOTE — ED Triage Notes (Signed)
Patient c/o central chest pain radiating to the right side and generalized abdominal pain x1 week. States he was d/c from [facility] x6 days ago for same. Reports N/V. Denies diarrhea.

## 2017-07-16 LAB — CG4 I-STAT (LACTIC ACID): LACTIC ACID, VENOUS: 1.35 mmol/L (ref 0.5–1.9)

## 2017-07-16 MED ORDER — MORPHINE SULFATE 15 MG PO TABS: 15.0000 mg | ORAL_TABLET | ORAL | 0 refills | Status: DC | PRN

## 2017-07-16 MED ORDER — GI COCKTAIL ~~LOC~~
30.0000 mL | Freq: Once | ORAL | Status: AC
Start: 2017-07-16 — End: 2017-07-16
  Administered 2017-07-16: 30 mL via ORAL
  Filled 2017-07-16: qty 30

## 2017-07-16 MED ORDER — PANTOPRAZOLE SODIUM 40 MG IV SOLR
40.0000 mg | Freq: Once | INTRAVENOUS | Status: AC
  Administered 2017-07-16: 40 mg via INTRAVENOUS
  Filled 2017-07-16: qty 40

## 2017-07-16 MED ORDER — HYDROMORPHONE HCL 1 MG/ML IJ SOLN
1.0000 mg | Freq: Once | INTRAMUSCULAR | Status: AC
  Administered 2017-07-16: 1 mg via INTRAVENOUS
  Filled 2017-07-16: qty 1

## 2017-07-16 NOTE — Discharge Instructions (Signed)
Try zantac 150mg  twice a day.   Try to avoid things that make this worse( alcohol, tobacco, spicy foods and tomato based products)

## 2017-07-16 NOTE — ED Notes (Signed)
ED Provider at bedside. 

## 2017-09-28 ENCOUNTER — Emergency Department (HOSPITAL_COMMUNITY)
Admission: EM | Admit: 2017-09-28 | Discharge: 2017-09-28 | Disposition: A | Discharge status: Home / Self Care | Payer: Self-pay | Attending: Emergency Medicine | Admitting: Emergency Medicine

## 2017-09-28 ENCOUNTER — Encounter (HOSPITAL_COMMUNITY): Payer: Self-pay | Admitting: Emergency Medicine

## 2017-09-28 ENCOUNTER — Emergency Department (HOSPITAL_COMMUNITY): Payer: Self-pay

## 2017-09-28 DIAGNOSIS — T148XXA Other injury of unspecified body region, initial encounter: Secondary | ICD-10-CM

## 2017-09-28 DIAGNOSIS — Y929 Unspecified place or not applicable: Secondary | ICD-10-CM | POA: Insufficient documentation

## 2017-09-28 DIAGNOSIS — K4090 Unilateral inguinal hernia, without obstruction or gangrene, not specified as recurrent: Secondary | ICD-10-CM | POA: Insufficient documentation

## 2017-09-28 DIAGNOSIS — J40 Bronchitis, not specified as acute or chronic: Secondary | ICD-10-CM | POA: Insufficient documentation

## 2017-09-28 DIAGNOSIS — X58XXXA Exposure to other specified factors, initial encounter: Secondary | ICD-10-CM | POA: Insufficient documentation

## 2017-09-28 DIAGNOSIS — R042 Hemoptysis: Secondary | ICD-10-CM | POA: Insufficient documentation

## 2017-09-28 DIAGNOSIS — Y939 Activity, unspecified: Secondary | ICD-10-CM | POA: Insufficient documentation

## 2017-09-28 DIAGNOSIS — Z79899 Other long term (current) drug therapy: Secondary | ICD-10-CM | POA: Insufficient documentation

## 2017-09-28 DIAGNOSIS — J449 Chronic obstructive pulmonary disease, unspecified: Secondary | ICD-10-CM | POA: Insufficient documentation

## 2017-09-28 DIAGNOSIS — F17210 Nicotine dependence, cigarettes, uncomplicated: Secondary | ICD-10-CM | POA: Insufficient documentation

## 2017-09-28 DIAGNOSIS — Y999 Unspecified external cause status: Secondary | ICD-10-CM | POA: Insufficient documentation

## 2017-09-28 DIAGNOSIS — S39012A Strain of muscle, fascia and tendon of lower back, initial encounter: Secondary | ICD-10-CM | POA: Insufficient documentation

## 2017-09-28 LAB — BASIC METABOLIC PANEL
Anion gap: 11 (ref 5–15)
BUN: 7 mg/dL (ref 6–20)
CALCIUM: 9.6 mg/dL (ref 8.9–10.3)
CO2: 23 mmol/L (ref 22–32)
Chloride: 102 mmol/L (ref 101–111)
Creatinine, Ser: 0.94 mg/dL (ref 0.61–1.24)
GFR calc Af Amer: >60 mL/min (ref >60)
GLUCOSE: 127 mg/dL — AB (ref 65–99)
Potassium: 3.6 mmol/L (ref 3.5–5.1)
SODIUM: 136 mmol/L (ref 135–145)

## 2017-09-28 LAB — CBC
HCT: 49.1 % (ref 39.0–52.0)
HEMOGLOBIN: 16.5 g/dL (ref 13.0–17.0)
MCH: 32.4 pg (ref 26.0–34.0)
MCHC: 33.6 g/dL (ref 30.0–36.0)
MCV: 96.3 fL (ref 78.0–100.0)
Platelets: 270 10*3/uL (ref 150–400)
RBC: 5.1 MIL/uL (ref 4.22–5.81)
RDW: 13.7 % (ref 11.5–15.5)
WBC: 16.2 10*3/uL — AB (ref 4.0–10.5)

## 2017-09-28 LAB — D-DIMER, QUANTITATIVE: D-Dimer, Quant: <0.27 ug/mL-FEU (ref 0.00–0.50)

## 2017-09-28 LAB — I-STAT TROPONIN, ED: TROPONIN I, POC: 0 ng/mL (ref 0.00–0.08)

## 2017-09-28 MED ORDER — ACETAMINOPHEN 500 MG PO TABS: 1000.0000 mg | ORAL_TABLET | Freq: Three times a day (TID) | ORAL | 0 refills | Status: AC

## 2017-09-28 MED ORDER — AZITHROMYCIN 250 MG PO TABS: 250.0000 mg | ORAL_TABLET | Freq: Every day | ORAL | 0 refills | Status: AC

## 2017-09-28 MED ORDER — AZITHROMYCIN 250 MG PO TABS
500.0000 mg | ORAL_TABLET | Freq: Once | ORAL | Status: AC
  Administered 2017-09-28: 500 mg via ORAL
  Filled 2017-09-28: qty 2

## 2017-09-28 MED ORDER — NAPROXEN SODIUM 220 MG PO TABS: 220.0000 mg | ORAL_TABLET | Freq: Two times a day (BID) | ORAL | Status: AC | PRN

## 2017-09-28 MED ORDER — CYCLOBENZAPRINE HCL 10 MG PO TABS: 10.0000 mg | ORAL_TABLET | Freq: Every day | ORAL | 0 refills | Status: AC

## 2017-09-28 MED ORDER — KETOROLAC TROMETHAMINE 60 MG/2ML IM SOLN
30.0000 mg | Freq: Once | INTRAMUSCULAR | Status: AC
  Administered 2017-09-28: 30 mg via INTRAMUSCULAR
  Filled 2017-09-28: qty 2

## 2017-09-28 NOTE — ED Provider Notes (Signed)
[facility] Provider Note  CSN: [id number] Arrival date & time: 09/28/17 1505  Chief Complaint(s) Chest Pain and Emesis  HPI Alejandro Ferguson is a 56 y.o. male chronic smoker who presents to the emergency department with hemoptysis for 5 days with right sided chest pain only with coughing. No fevers or chills.  Chest pain is sharp in nature, again only associated with coughing.  Pain is nonradiating, nonexertional.  No associated shortness of breath.  No other alleviating or aggravating factors.   Patient also reports back pain since last night after heavy lifting.  He denies any bladder/bowel incontinence.  No lower extremity weakness or loss of sensation.  He is also endorsing left inguinal hernia which is chronic for the patient but states that he has been noticing that the size of the hernia has been increasing.  He endorses mild discomfort.  Still has bowel movements.  Denies any abdominal pain.  No urinary symptoms.  No scrotal pain or swelling.  HPI  Past Medical History Past Medical History:  Diagnosis Date  . Anxiety   . COPD (chronic obstructive pulmonary disease) (HCC)   . Emphysema   . ETOH abuse   . Pneumonia    "several times" (07/09/2017)  . Polysubstance abuse (HCC)    THC, Cocaine  . Tuberculosis    "when I was little" (07/09/2017)   Patient Active Problem List   Diagnosis Date Noted  . Intractable abdominal pain 07/09/2017  . Pneumonia 08/25/2011  . Tobacco abuse 08/25/2011  . Hip pain, right 08/25/2011   Home Medication(s) Prior to Admission medications   Medication Sig Start Date End Date Taking? Authorizing Provider  acetaminophen (TYLENOL) 500 MG tablet Take 2 tablets (1,000 mg total) by mouth every 8 (eight) hours. Do not take more than 4000 mg of acetaminophen (Tylenol) in a 24-hour period. Please note that other medicines that you may be prescribed may have Tylenol as well. 09/28/17 10/03/17  Nira Connardama, Pedro Eduardo, MD   azithromycin (ZITHROMAX) 250 MG tablet Take 1 tablet (250 mg total) by mouth daily. Take first 2 tablets together, then 1 every day until finished. 09/29/17 10/03/17  Nira Connardama, Pedro Eduardo, MD  cyclobenzaprine (FLEXERIL) 10 MG tablet Take 1 tablet (10 mg total) by mouth at bedtime. 09/28/17 10/08/17  Nira Connardama, Pedro Eduardo, MD  docusate sodium (COLACE) 100 MG capsule Take 1 capsule (100 mg total) by mouth 2 (two) times daily. Patient not taking: Reported on 09/28/2017 07/11/17 07/11/18  Valentino NoseBoswell, Nathan, MD  morphine (MSIR) 15 MG tablet Take 1 tablet (15 mg total) by mouth every 4 (four) hours as needed for severe pain. Patient not taking: Reported on 09/28/2017 07/16/17   Melene PlanFloyd, Dan, DO  naproxen sodium (ALEVE) 220 MG tablet Take 1-2 tablets (220-440 mg total) by mouth 2 (two) times daily as needed. 09/28/17 10/08/17  Nira Connardama, Pedro Eduardo, MD  ondansetron (ZOFRAN) 4 MG tablet Take 1 tablet (4 mg total) by mouth every 8 (eight) hours as needed for nausea or vomiting. Patient not taking: Reported on 09/28/2017 07/11/17 07/11/18  Valentino NoseBoswell, Nathan, MD  oxyCODONE-acetaminophen (PERCOCET) 7.5-325 MG tablet Take 1 tablet by mouth every 6 (six) hours as needed for severe pain. Patient not taking: Reported on 09/28/2017 07/11/17   Valentino NoseBoswell, Nathan, MD  Past Surgical History Past Surgical History:  Procedure Laterality Date  . ANTERIOR CERVICAL DECOMP/DISCECTOMY FUSION    . APPENDECTOMY    . BACK SURGERY    . INGUINAL HERNIA REPAIR Left   . ORBITAL FRACTURE SURGERY Right    "I've got a plate over my right eye"  . SHOULDER ARTHROSCOPY WITH ROTATOR CUFF REPAIR Left   . ULNAR NERVE REPAIR Right    "@ elbow"   Family History Family History  Problem Relation Age of Onset  . Hypertension Mother   . Lung cancer Unknown     Social History Social History  Substance Use Topics  .  Smoking status: Current Every Day Smoker    Packs/day: 1.00    Years: 38.00    Types: Cigarettes  . Smokeless tobacco: Never Used  . Alcohol use 10.8 oz/week    18 Cans of beer per week     Comment: 07/09/2017 "maybe 18/ beersweek on average"   Allergies Penicillins  Review of Systems Review of Systems All other systems are reviewed and are negative for acute change except as noted in the HPI  Physical Exam Vital Signs  I have reviewed the triage vital signs BP (!) 137/92 (BP Location: Right Arm)   Pulse 95   Temp 98.1 F (36.7 C) (Oral)   Resp 12   Ht 6' (1.829 m)   Wt 79.4 kg (175 lb)   SpO2 96%   BMI 23.73 kg/m   Physical Exam  Constitutional: He is oriented to person, place, and time. He appears well-developed and well-nourished. No distress.  HENT:  Head: Normocephalic and atraumatic.  Nose: Nose normal.  Eyes: Pupils are equal, round, and reactive to light. Conjunctivae and EOM are normal. Right eye exhibits no discharge. Left eye exhibits no discharge. No scleral icterus.  Neck: Normal range of motion. Neck supple.  Cardiovascular: Normal rate and regular rhythm.  Exam reveals no gallop and no friction rub.   No murmur heard. Pulmonary/Chest: Effort normal and breath sounds normal. No stridor. No respiratory distress. He has no rales.  Abdominal: Soft. He exhibits no distension. There is no tenderness. There is no rigidity, no rebound and no guarding. A hernia is present. Hernia confirmed positive in the left inguinal area (reducible).  Musculoskeletal: He exhibits no edema or tenderness.  Lymphadenopathy: No inguinal adenopathy noted on the left side.  Neurological: He is alert and oriented to person, place, and time.  Skin: Skin is warm and dry. No rash noted. He is not diaphoretic. No erythema.  Psychiatric: He has a normal mood and affect.  Vitals reviewed.   ED Results and Treatments Labs (all labs ordered are listed, but only abnormal results are  displayed) Labs Reviewed  BASIC METABOLIC PANEL - Abnormal; Notable for the following:       Result Value   Glucose, Bld 127 (*)    All other components within normal limits  CBC - Abnormal; Notable for the following:    WBC 16.2 (*)    All other components within normal limits  D-DIMER, QUANTITATIVE (NOT AT [facility])  I-STAT TROPONIN, ED  EKG  EKG Interpretation  Date/Time:  Tuesday September 28 2017 15:15:37 EDT Ventricular Rate:  75 PR Interval:  130 QRS Duration: 102 QT Interval:  370 QTC Calculation: 413 R Axis:   82 Text Interpretation:  Normal sinus rhythm with sinus arrhythmia Incomplete right bundle branch block Borderline ECG No significant change since last tracing Confirmed by Drema Pry [phone]) on 09/28/2017 9:32:12 PM      Radiology Dg Chest 2 View  Result Date: 09/28/2017 CLINICAL DATA:  Productive cough, nausea and vomiting for 3 days. EXAM: CHEST  2 VIEW COMPARISON:  CT chest 05/13/2016. PA and lateral chest 05/13/2016 and 07/15/2017. FINDINGS: The lungs are markedly emphysematous with bullous change in the apices, worse on the left. No consolidative process, pneumothorax or effusion. Heart size is normal. No acute bony abnormality. IMPRESSION: Advanced emphysema without acute disease. Electronically Signed   By: Drusilla Kanner M.D.   On: 09/28/2017 15:43   Pertinent labs & imaging results that were available during my care of the patient were reviewed by me and considered in my medical decision making (see chart for details).  Medications Ordered in ED Medications  ketorolac (TORADOL) injection 30 mg (30 mg Intramuscular Given 09/28/17 2159)  azithromycin (ZITHROMAX) tablet 500 mg (500 mg Oral Given 09/28/17 2158)                                                                                                                                      Procedures Procedures  (including critical care time)  Medical Decision Making / ED Course I have reviewed the nursing notes for this encounter and the patient's prior records (if available in EHR or on provided paperwork).    1.  Hemoptysis with posttussive chest pain Highly consistent with ACS.  EKG without acute ischemic changes or evidence of pericarditis.  Troponin negative.  Feel that this is sufficient to rule out ACS given the highly atypical nature. Chest x-ray without evidence suggestive of pneumonia, pneumothorax, pneumomediastinum.  No abnormal contour of the mediastinum to suggest dissection. No evidence of acute injuries.  Low pretest probability for pulmonary embolism however patient does have hemoptysis and we are unable to perk him out.  D-dimer negative.  Presentation is most consistent with bronchitis.  Given his chronic smoking history, patient was treated with azithromycin.  Will DC with Z-Pak prescription.  2.  Lower back pain  No acute traumatic onset. No red flag symptoms of fever, weight loss, saddle anesthesia, weakness, fecal/urinary incontinence or urinary retention.   Suspect MSK etiology. No indication for imaging emergently. Patient was recommended to take short course of scheduled NSAIDs and engage in early mobility as definitive treatment. Return precautions discussed for worsening or new concerning symptoms.   3.  Left inguinal hernia Easily reducible.  Low suspicion for incarceration or strength relation.  No indication for imaging at this time.  The patient is safe for discharge with strict  return precautions.   Final Clinical Impression(s) / ED Diagnoses Final diagnoses:  Bronchitis  Left inguinal hernia  Muscle strain    Disposition: Discharge  Condition: Good  I have discussed the results, Dx and Tx plan with the patient who expressed understanding and agree(s) with the plan. Discharge instructions discussed at great length. The patient was  given strict return precautions who verbalized understanding of the instructions. No further questions at time of discharge.    New Prescriptions   ACETAMINOPHEN (TYLENOL) 500 MG TABLET    Take 2 tablets (1,000 mg total) by mouth every 8 (eight) hours. Do not take more than 4000 mg of acetaminophen (Tylenol) in a 24-hour period. Please note that other medicines that you may be prescribed may have Tylenol as well.   AZITHROMYCIN (ZITHROMAX) 250 MG TABLET    Take 1 tablet (250 mg total) by mouth daily. Take first 2 tablets together, then 1 every day until finished.   CYCLOBENZAPRINE (FLEXERIL) 10 MG TABLET    Take 1 tablet (10 mg total) by mouth at bedtime.   NAPROXEN SODIUM (ALEVE) 220 MG TABLET    Take 1-2 tablets (220-440 mg total) by mouth 2 (two) times daily as needed.    Follow Up: Primary care provider   in 5-7 days, If symptoms do not improve or  worsen     This chart was dictated using voice recognition software.  Despite best efforts to proofread,  errors can occur which can change the documentation meaning.   Nira Conn, MD 09/28/17 [phone]

## 2017-09-28 NOTE — ED Triage Notes (Addendum)
Cp started  Last night has had some vomiting  And states coughing up blood  X 2 weeks also has back pain after helping dfriend move a transmission last night and his his hernia left groin has been growing in size

## 2017-09-28 NOTE — ED Notes (Signed)
Pt sated that he will be back

## 2017-11-17 ENCOUNTER — Ambulatory Visit (INDEPENDENT_AMBULATORY_CARE_PROVIDER_SITE_OTHER): Payer: Self-pay | Admitting: Physician Assistant

## 2017-11-29 ENCOUNTER — Ambulatory Visit (INDEPENDENT_AMBULATORY_CARE_PROVIDER_SITE_OTHER): Payer: Self-pay | Admitting: Physician Assistant

## 2018-09-20 IMAGING — CT CT CTA ABD/PEL W/CM AND/OR W/O CM
2 of 12 series · 12 of 46 positions shown, 17 images · IV contrast (APPLIED)
Comparison: None.

CLINICAL DATA: Abdominal rigidity and severe, diffuse, abdominal
pain beginning about an hour ago.

EXAM:
CTA ABDOMEN AND PELVIS WITH CONTRAST
TECHNIQUE: Multidetector CT imaging of the abdomen and pelvis was performed
using the standard protocol during bolus administration of
intravenous contrast. Multiplanar reconstructed images and MIPs were
obtained and reviewed to evaluate the vascular anatomy.
CONTRAST:  100 mL Isovue 370 IV

[Series 8: cor · coronal · 0.75mm/px · 2 of 127 slices shown]
[im 43/127  soft-tissue]
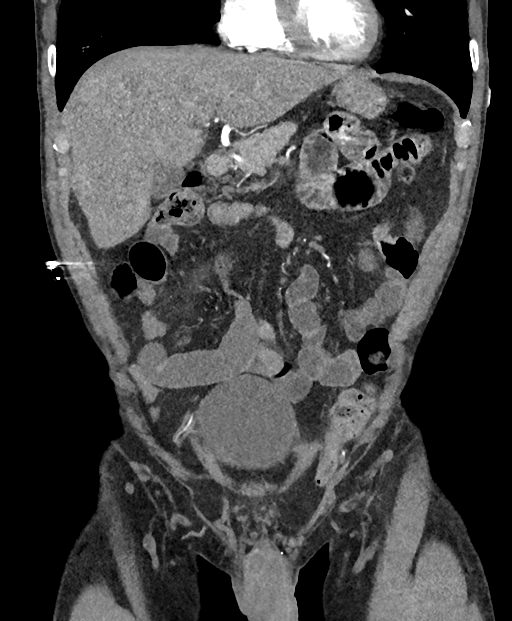
[im 85/127  soft-tissue]
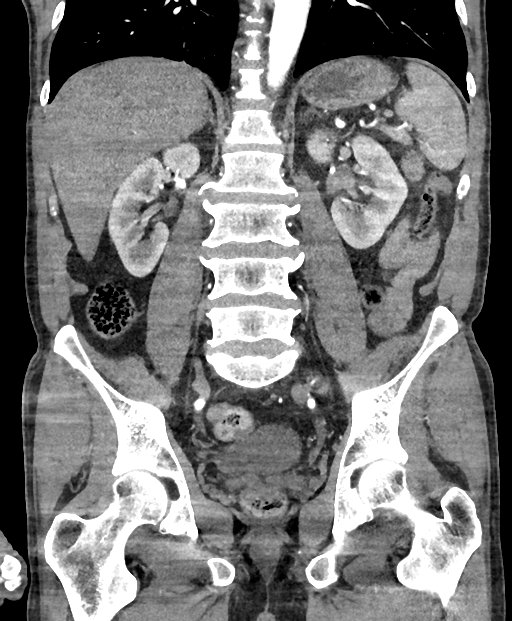

[Series 13: venous thins · axial · portal-venous · 0.74mm/px · z∈[-857,-469]mm · 10 of 1165 slices shown, 15 images]
[im 98/1165  soft-tissue]
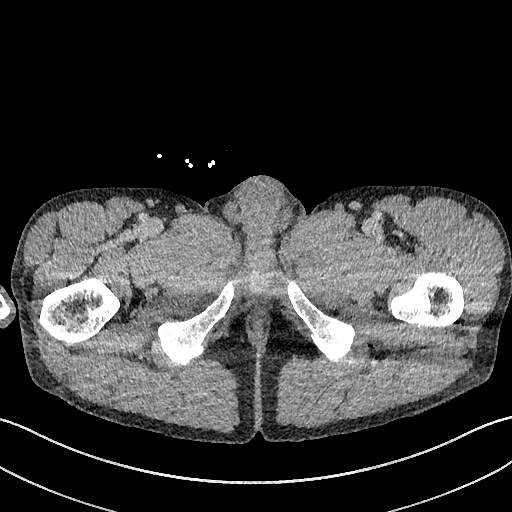
[im 98/1165  bone]
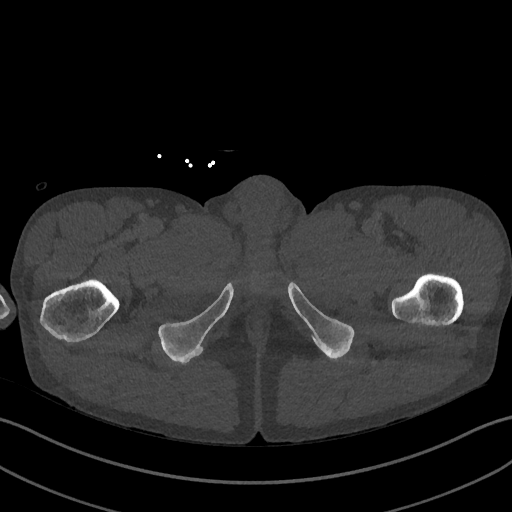
[im 195/1165  soft-tissue]
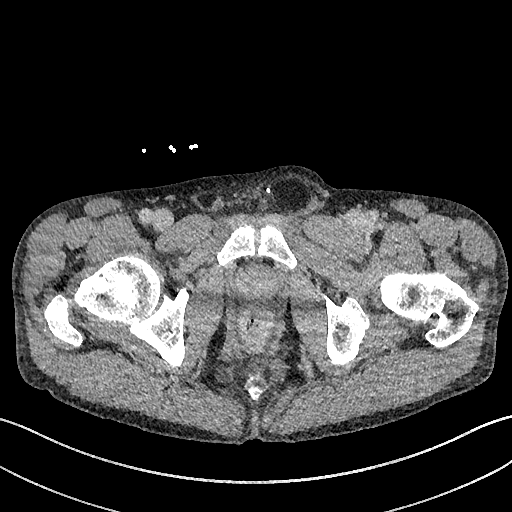
[im 389/1165  soft-tissue]
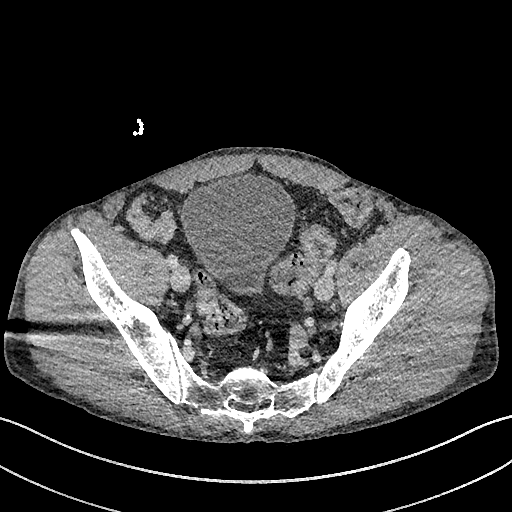
[im 486/1165  soft-tissue]
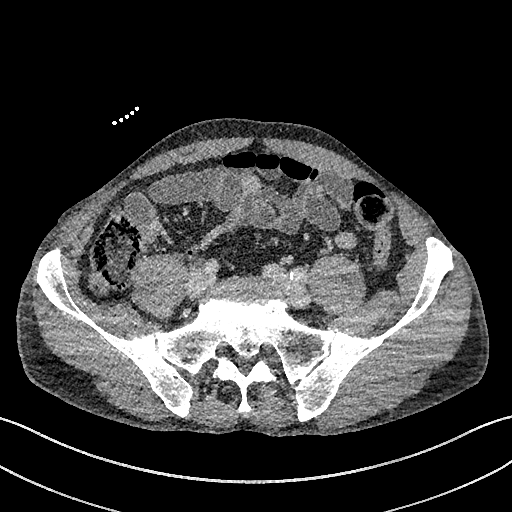
[im 583/1165  soft-tissue]
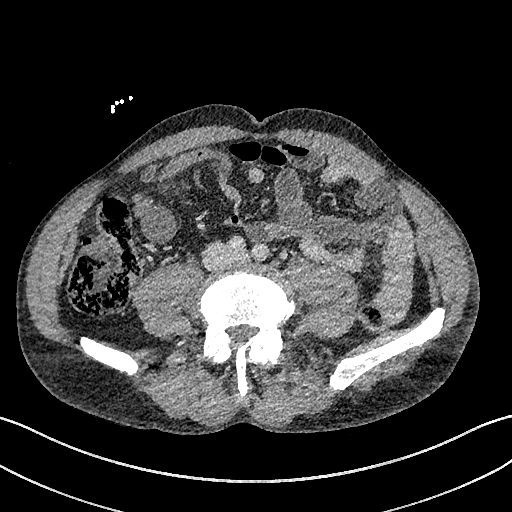
[im 680/1165  soft-tissue]
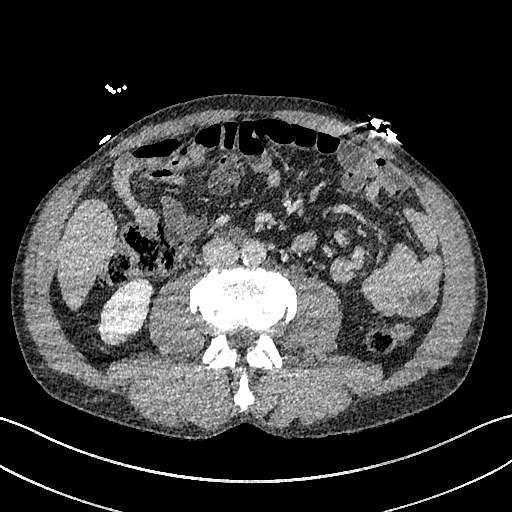
[im 777/1165  soft-tissue]
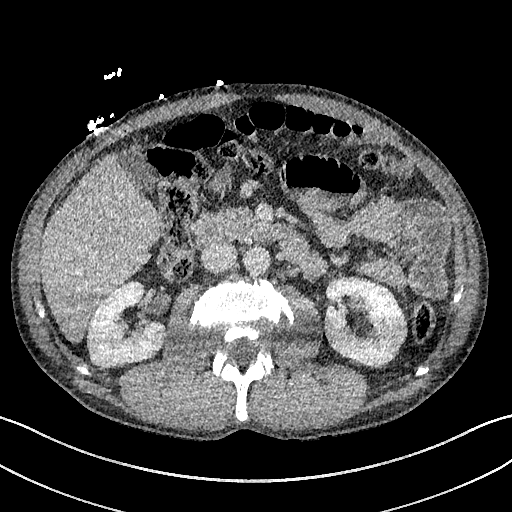
[im 777/1165  lung]
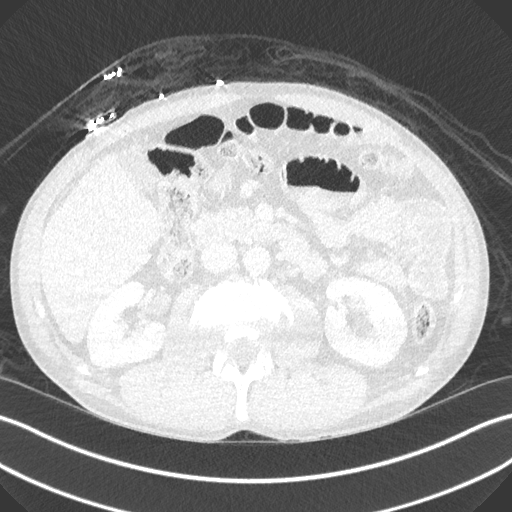
[im 874/1165  lung]
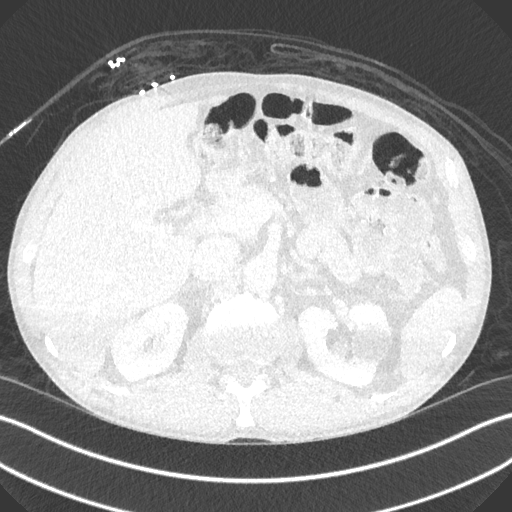
[im 971/1165  soft-tissue]
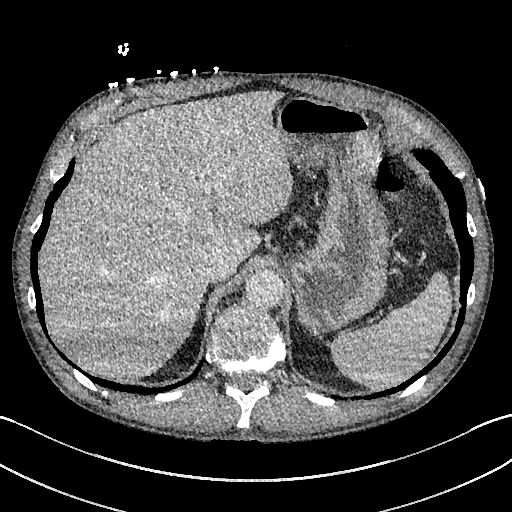
[im 971/1165  lung]
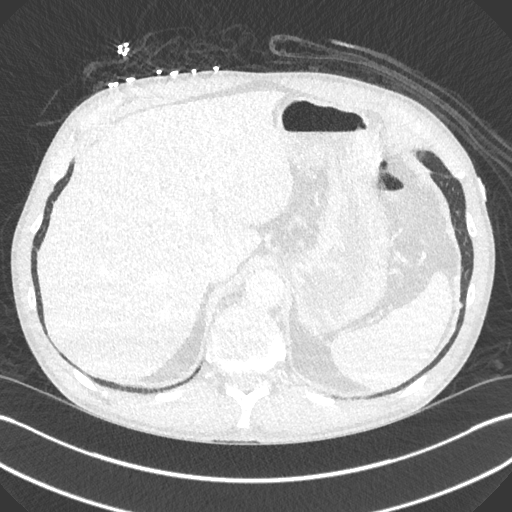
[im 1068/1165  soft-tissue]
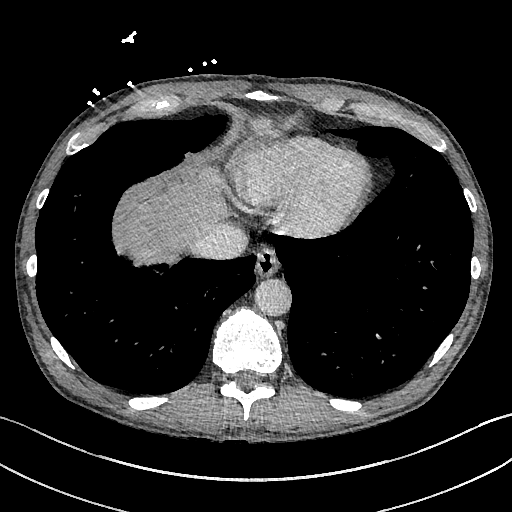
[im 1068/1165  lung]
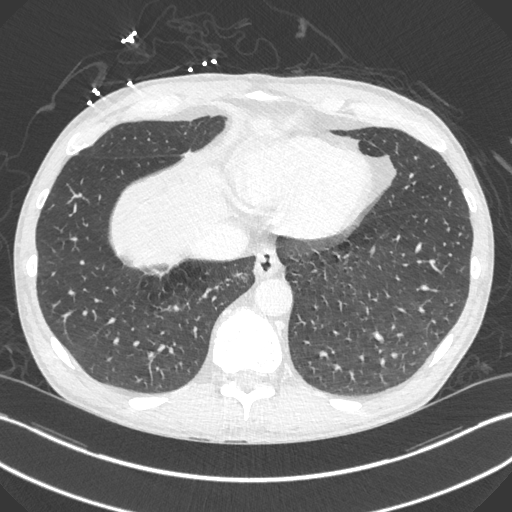
[im 1068/1165  bone]
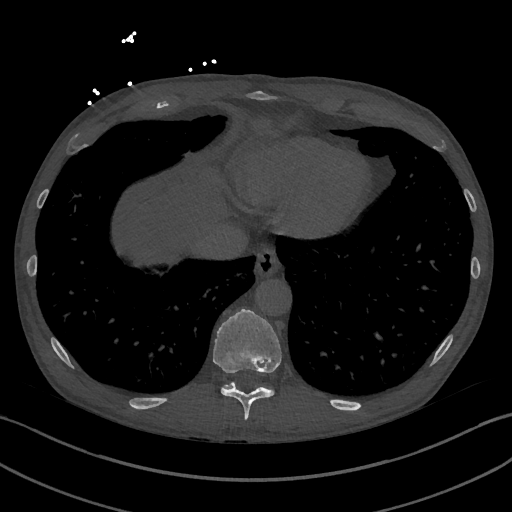

[12 of 46 positions shown; findings below may reference images not displayed]

FINDINGS: VASCULAR

Aorta: Minimal calcified plaque in the infrarenal segment. No
aneurysm, dissection, or stenosis.

Celiac: Patent without evidence of aneurysm, dissection, vasculitis
or significant stenosis.

SMA: Patent without evidence of aneurysm, dissection, vasculitis or
significant stenosis.

Renals: Duplicated on the left, superior dominant, both widely
patent. Single right, widely patent.

IMA: Patent without evidence of aneurysm, dissection, vasculitis or
significant stenosis.

Inflow: Mild scattered calcified plaque. No aneurysm, dissection, or
stenosis.

Proximal Outflow: Bilateral common femoral and visualized portions
of the superficial and profunda femoral arteries are patent without
evidence of aneurysm, dissection, vasculitis or significant
stenosis.

Veins: Dedicated venous phase imaging not obtained.

Review of the MIP images confirms the above findings.

NON-VASCULAR

Lower chest: Triangular 7 mm pleural-based nodule, inferior lingula
image [DATE], present since 07/21/2011 suggesting benignity e.g.
intrapulmonary lymph node. No pleural or pericardial effusion.

Hepatobiliary: No focal liver abnormality is seen. No gallstones,
gallbladder wall thickening, or biliary dilatation.

Pancreas: Unremarkable. No pancreatic ductal dilatation or
surrounding inflammatory changes.

Spleen: Normal in size without focal abnormality.

Adrenals/Urinary Tract: Normal adrenals. Stable 3 cm upper pole left
renal cyst. No hydronephrosis. Urinary bladder physiologically
distended.

Stomach/Bowel: Stomach, small bowel, and colon are nondilated.
Appendix surgically absent. Proximal sigmoid colon protrudes into
left inguinal hernia without obstruction or strangulation.

Lymphatic: No abdominal or pelvic adenopathy localized.

Reproductive: Prostatic enlargement. Enlarged lobular seminal
vesicles. No discrete mass.

Other: No ascites.  No free air.

Musculoskeletal: Left inguinal hernia involving proximal sigmoid
colon without obstruction or strangulation. Small anterior endplate
spurs throughout the lumbar spine. Benign bone island in the right
femoral neck stable since 08/24/2011. Negative for fracture or
worrisome bone lesion.
IMPRESSION: VASCULAR

1. Minimal aortoiliac arterial plaque.
2. No significant proximal mesenteric arterial occlusive disease.

NON-VASCULAR

1. Left inguinal hernia involving proximal sigmoid colon without
obstruction or strangulation.
2. Additional nonacute findings including
Stable left renal cyst
Probable intrapulmonary lymph node in the lingula

## 2018-09-20 IMAGING — DX DG CHEST 2V
4 series · 4 of 4 positions shown · non-contrast
Comparison: 03/15/2017

CLINICAL DATA: Central and right chest pain radiating to the back.
Cough and shortness of breath.

EXAM:
CHEST  2 VIEW

[chest lat (1 of 2)]
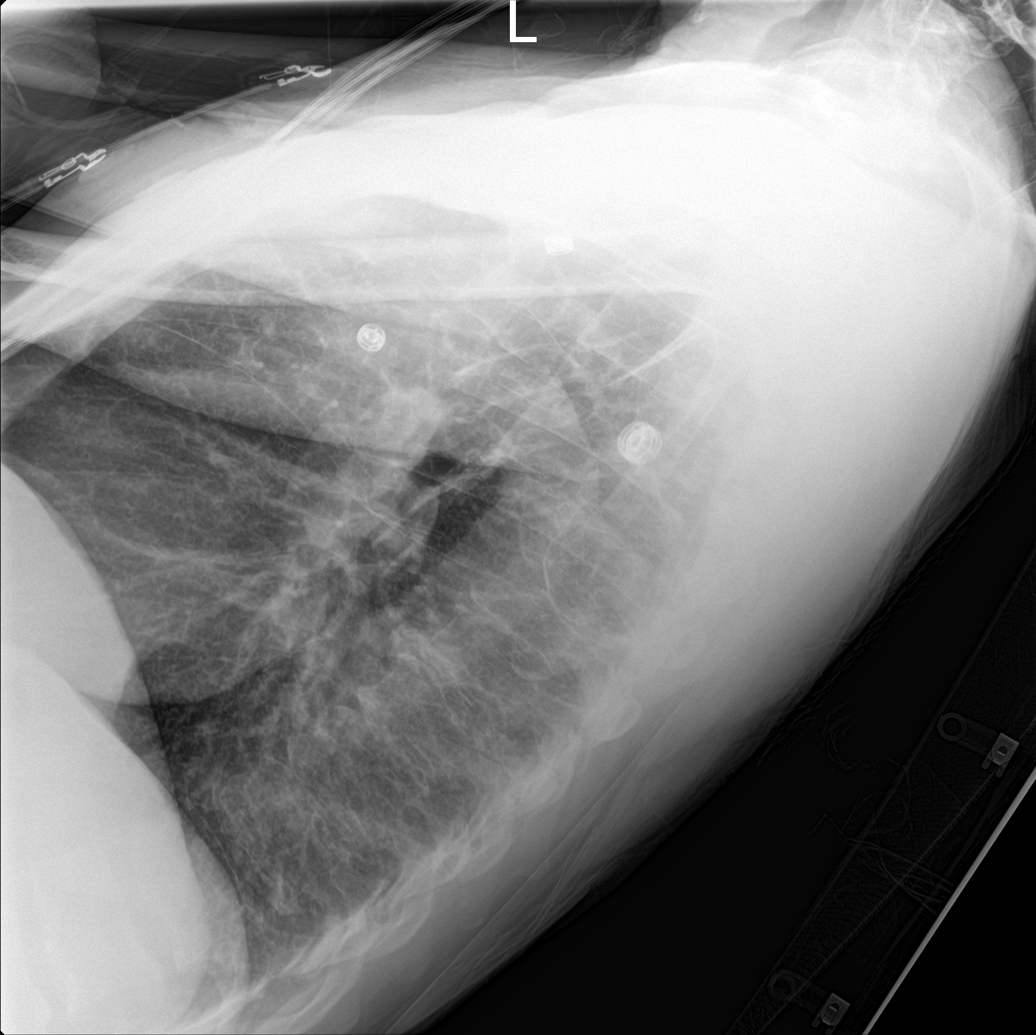

[chest ap (1 of 2)]
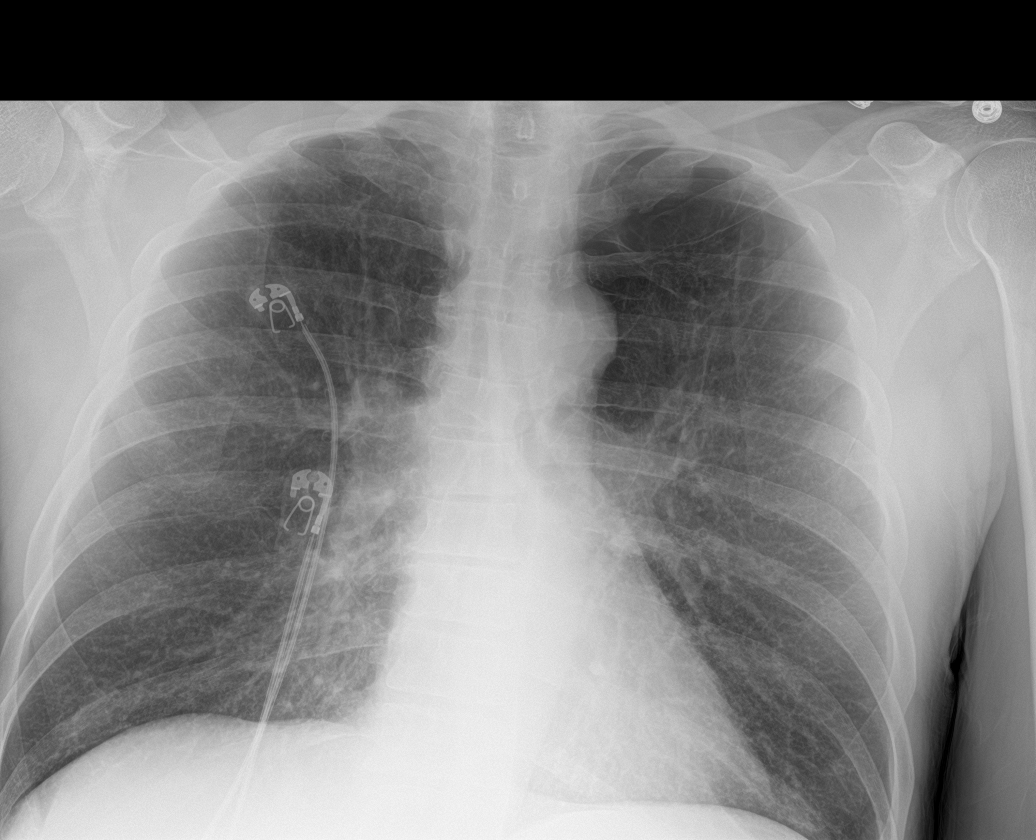

[chest lat (2 of 2)]
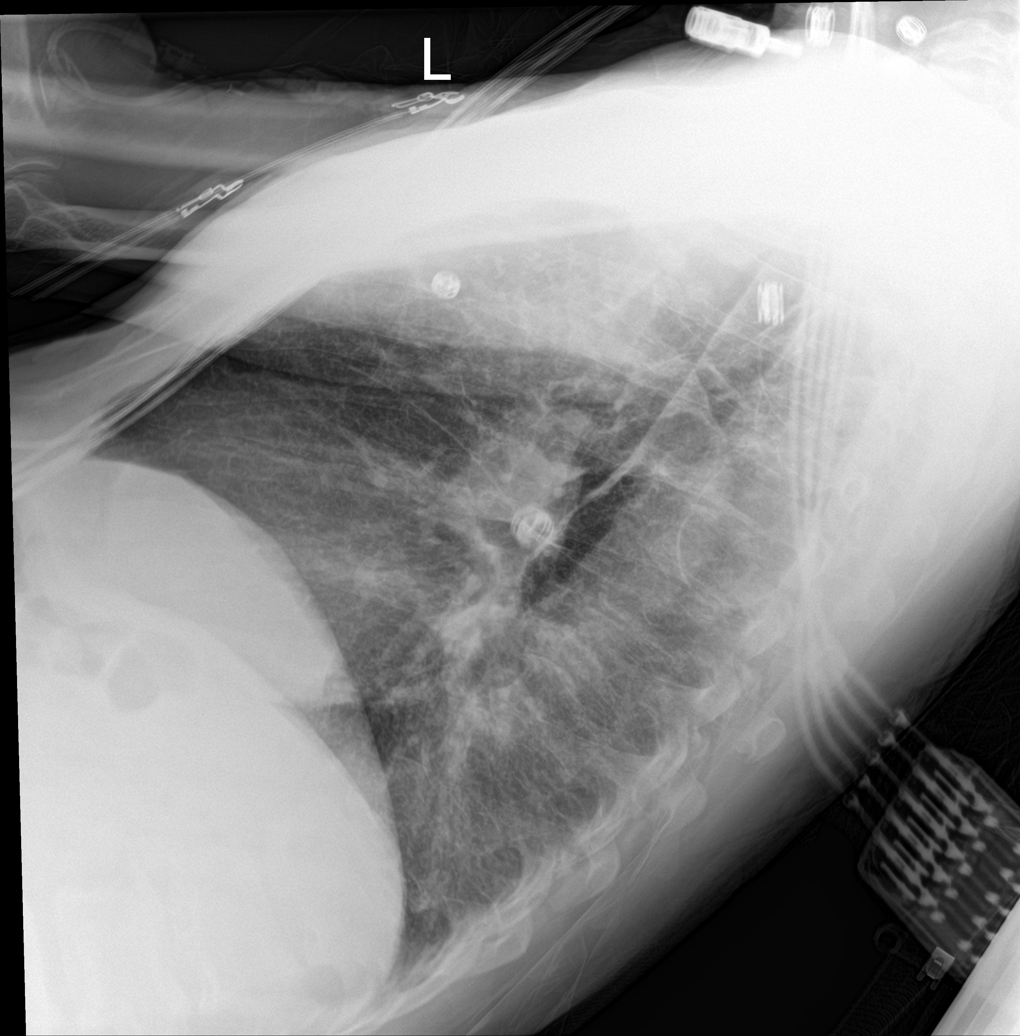

[chest ap (2 of 2)]
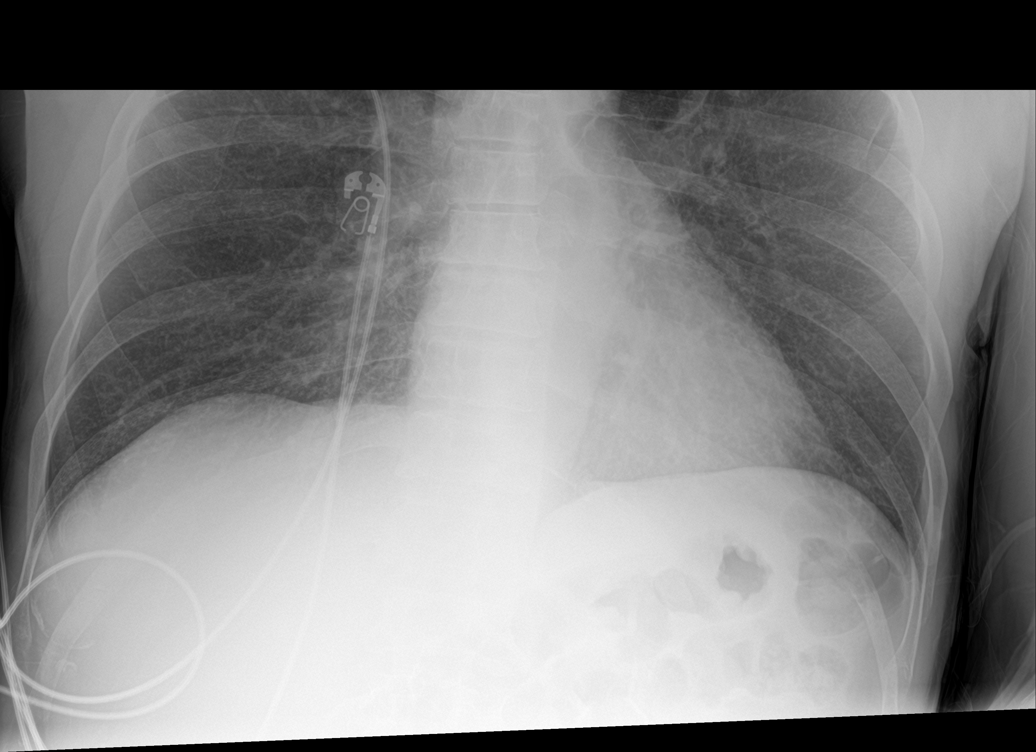

[4 of 4 positions shown; findings below may reference images not displayed]

FINDINGS: Severe emphysematous changes with large blebs at the left lung apex.
No new airspace disease. No evidence for pulmonary edema. Heart and
mediastinum are within normal limits. No large pleural effusions.
Degenerative endplate changes in thoracic spine. Negative for a
pneumothorax.
IMPRESSION: Emphysema without acute chest findings.

## 2019-02-10 ENCOUNTER — Other Ambulatory Visit: Payer: Self-pay

## 2019-02-10 ENCOUNTER — Emergency Department (HOSPITAL_COMMUNITY): Payer: Self-pay

## 2019-02-10 ENCOUNTER — Encounter (HOSPITAL_COMMUNITY): Payer: Self-pay

## 2019-02-10 ENCOUNTER — Inpatient Hospital Stay (HOSPITAL_COMMUNITY)
Admission: EM | Admit: 2019-02-10 | Discharge: 2019-02-12 | DRG: 195 | Disposition: A | Discharge status: Home / Self Care | Payer: Self-pay | Attending: Internal Medicine | Admitting: Internal Medicine

## 2019-02-10 DIAGNOSIS — F17211 Nicotine dependence, cigarettes, in remission: Secondary | ICD-10-CM | POA: Diagnosis present

## 2019-02-10 DIAGNOSIS — J181 Lobar pneumonia, unspecified organism: Secondary | ICD-10-CM

## 2019-02-10 DIAGNOSIS — R0781 Pleurodynia: Secondary | ICD-10-CM | POA: Diagnosis present

## 2019-02-10 DIAGNOSIS — I491 Atrial premature depolarization: Secondary | ICD-10-CM | POA: Diagnosis present

## 2019-02-10 DIAGNOSIS — F1010 Alcohol abuse, uncomplicated: Secondary | ICD-10-CM | POA: Diagnosis present

## 2019-02-10 DIAGNOSIS — R1013 Epigastric pain: Secondary | ICD-10-CM | POA: Diagnosis present

## 2019-02-10 DIAGNOSIS — Z8701 Personal history of pneumonia (recurrent): Secondary | ICD-10-CM

## 2019-02-10 DIAGNOSIS — Z79899 Other long term (current) drug therapy: Secondary | ICD-10-CM

## 2019-02-10 DIAGNOSIS — Z880 Allergy status to penicillin: Secondary | ICD-10-CM

## 2019-02-10 DIAGNOSIS — Z8249 Family history of ischemic heart disease and other diseases of the circulatory system: Secondary | ICD-10-CM

## 2019-02-10 DIAGNOSIS — Z23 Encounter for immunization: Secondary | ICD-10-CM

## 2019-02-10 DIAGNOSIS — Z7982 Long term (current) use of aspirin: Secondary | ICD-10-CM

## 2019-02-10 DIAGNOSIS — J439 Emphysema, unspecified: Secondary | ICD-10-CM | POA: Diagnosis present

## 2019-02-10 DIAGNOSIS — Z79891 Long term (current) use of opiate analgesic: Secondary | ICD-10-CM

## 2019-02-10 DIAGNOSIS — J189 Pneumonia, unspecified organism: Principal | ICD-10-CM | POA: Diagnosis present

## 2019-02-10 DIAGNOSIS — Z801 Family history of malignant neoplasm of trachea, bronchus and lung: Secondary | ICD-10-CM

## 2019-02-10 LAB — BASIC METABOLIC PANEL
Anion gap: 12 (ref 5–15)
BUN: 9 mg/dL (ref 6–20)
CO2: 21 mmol/L — ABNORMAL LOW (ref 22–32)
Calcium: 8.9 mg/dL (ref 8.9–10.3)
Chloride: 99 mmol/L (ref 98–111)
Creatinine, Ser: 0.99 mg/dL (ref 0.61–1.24)
GFR calc Af Amer: >60 mL/min (ref >60)
GFR calc non Af Amer: >60 mL/min (ref >60)
Glucose, Bld: 122 mg/dL — ABNORMAL HIGH (ref 70–99)
Potassium: 3.5 mmol/L (ref 3.5–5.1)
SODIUM: 132 mmol/L — AB (ref 135–145)

## 2019-02-10 LAB — RESPIRATORY PANEL BY PCR
Adenovirus: NOT DETECTED
Bordetella pertussis: NOT DETECTED
CORONAVIRUS 229E-RVPPCR: NOT DETECTED
Chlamydophila pneumoniae: NOT DETECTED
Coronavirus HKU1: NOT DETECTED
Coronavirus NL63: NOT DETECTED
Coronavirus OC43: NOT DETECTED
Influenza A: NOT DETECTED
Influenza B: NOT DETECTED
Metapneumovirus: NOT DETECTED
Mycoplasma pneumoniae: NOT DETECTED
PARAINFLUENZA VIRUS 1-RVPPCR: NOT DETECTED
Parainfluenza Virus 2: NOT DETECTED
Parainfluenza Virus 3: NOT DETECTED
Parainfluenza Virus 4: NOT DETECTED
RESPIRATORY SYNCYTIAL VIRUS-RVPPCR: NOT DETECTED
Rhinovirus / Enterovirus: NOT DETECTED

## 2019-02-10 LAB — CBC
HCT: 51.3 % (ref 39.0–52.0)
HEMOGLOBIN: 17.4 g/dL — AB (ref 13.0–17.0)
MCH: 31.9 pg (ref 26.0–34.0)
MCHC: 33.9 g/dL (ref 30.0–36.0)
MCV: 94 fL (ref 80.0–100.0)
Platelets: 221 10*3/uL (ref 150–400)
RBC: 5.46 MIL/uL (ref 4.22–5.81)
RDW: 12.9 % (ref 11.5–15.5)
WBC: 14.9 10*3/uL — ABNORMAL HIGH (ref 4.0–10.5)
nRBC: 0 % (ref 0.0–0.2)

## 2019-02-10 LAB — I-STAT TROPONIN, ED: Troponin i, poc: 0.01 ng/mL (ref 0.00–0.08)

## 2019-02-10 LAB — LACTIC ACID, PLASMA: Lactic Acid, Venous: 0.8 mmol/L (ref 0.5–1.9)

## 2019-02-10 MED ORDER — GUAIFENESIN-DM 100-10 MG/5ML PO SYRP
5.0000 mL | ORAL_SOLUTION | ORAL | Status: DC | PRN
  Administered 2019-02-10 – 2019-02-11 (×4): 5 mL via ORAL
  Filled 2019-02-10 (×4): qty 5

## 2019-02-10 MED ORDER — SODIUM CHLORIDE 0.9 % IV SOLN
INTRAVENOUS | Status: AC
  Administered 2019-02-10: 23:00:00 via INTRAVENOUS

## 2019-02-10 MED ORDER — MORPHINE SULFATE (PF) 2 MG/ML IV SOLN
1.0000 mg | Freq: Once | INTRAVENOUS | Status: AC
  Administered 2019-02-10: 1 mg via INTRAVENOUS
  Filled 2019-02-10: qty 1

## 2019-02-10 MED ORDER — ACETAMINOPHEN 650 MG RE SUPP: 650.0000 mg | Freq: Four times a day (QID) | RECTAL | Status: DC | PRN

## 2019-02-10 MED ORDER — SODIUM CHLORIDE 0.9 % IV SOLN
1.0000 g | INTRAVENOUS | Status: DC
  Administered 2019-02-11: 1 g via INTRAVENOUS
  Filled 2019-02-10: qty 10

## 2019-02-10 MED ORDER — ONDANSETRON HCL 4 MG PO TABS: 4.0000 mg | ORAL_TABLET | Freq: Four times a day (QID) | ORAL | Status: DC | PRN

## 2019-02-10 MED ORDER — IBUPROFEN 400 MG PO TABS
600.0000 mg | ORAL_TABLET | Freq: Once | ORAL | Status: AC
  Administered 2019-02-10: 600 mg via ORAL
  Filled 2019-02-10: qty 1

## 2019-02-10 MED ORDER — ENOXAPARIN SODIUM 40 MG/0.4ML ~~LOC~~ SOLN
40.0000 mg | SUBCUTANEOUS | Status: DC
  Administered 2019-02-11: 40 mg via SUBCUTANEOUS
  Filled 2019-02-10 (×2): qty 0.4

## 2019-02-10 MED ORDER — SODIUM CHLORIDE 0.9 % IV SOLN
500.0000 mg | Freq: Once | INTRAVENOUS | Status: AC
  Administered 2019-02-10: 500 mg via INTRAVENOUS
  Filled 2019-02-10: qty 500

## 2019-02-10 MED ORDER — SODIUM CHLORIDE 0.9 % IV SOLN
1.0000 g | Freq: Once | INTRAVENOUS | Status: AC
  Administered 2019-02-10: 1 g via INTRAVENOUS
  Filled 2019-02-10: qty 10

## 2019-02-10 MED ORDER — LACTATED RINGERS IV BOLUS
1000.0000 mL | Freq: Once | INTRAVENOUS | Status: AC
  Administered 2019-02-10: 1000 mL via INTRAVENOUS

## 2019-02-10 MED ORDER — CYCLOBENZAPRINE HCL 10 MG PO TABS
10.0000 mg | ORAL_TABLET | Freq: Once | ORAL | Status: AC
  Administered 2019-02-10: 10 mg via ORAL
  Filled 2019-02-10: qty 1

## 2019-02-10 MED ORDER — SODIUM CHLORIDE 0.9% FLUSH
3.0000 mL | Freq: Once | INTRAVENOUS | Status: AC
  Administered 2019-02-10: 3 mL via INTRAVENOUS

## 2019-02-10 MED ORDER — ONDANSETRON HCL 4 MG/2ML IJ SOLN
4.0000 mg | Freq: Once | INTRAMUSCULAR | Status: AC
  Administered 2019-02-10: 4 mg via INTRAVENOUS
  Filled 2019-02-10: qty 2

## 2019-02-10 MED ORDER — SODIUM CHLORIDE 0.9 % IV SOLN
500.0000 mg | INTRAVENOUS | Status: DC
  Administered 2019-02-11: 500 mg via INTRAVENOUS
  Filled 2019-02-10: qty 500

## 2019-02-10 MED ORDER — ALBUTEROL SULFATE (2.5 MG/3ML) 0.083% IN NEBU: 2.5000 mg | INHALATION_SOLUTION | RESPIRATORY_TRACT | Status: DC | PRN

## 2019-02-10 MED ORDER — DOXYCYCLINE HYCLATE 100 MG PO CAPS: 100.0000 mg | ORAL_CAPSULE | Freq: Two times a day (BID) | ORAL | 0 refills | Status: DC

## 2019-02-10 MED ORDER — ONDANSETRON HCL 4 MG/2ML IJ SOLN
4.0000 mg | Freq: Four times a day (QID) | INTRAMUSCULAR | Status: DC | PRN
  Administered 2019-02-11: 4 mg via INTRAVENOUS
  Filled 2019-02-10: qty 2

## 2019-02-10 MED ORDER — ACETAMINOPHEN 325 MG PO TABS
650.0000 mg | ORAL_TABLET | Freq: Four times a day (QID) | ORAL | Status: DC | PRN
  Administered 2019-02-11 (×2): 650 mg via ORAL
  Filled 2019-02-10 (×2): qty 2

## 2019-02-10 NOTE — ED Notes (Signed)
ED TO INPATIENT HANDOFF REPORT  ED Nurse Name and Phone #:  Alexia Freestoneatty, RN [phone]  S Name/Age/Gender Alejandro Ferguson 58 y.o. male Room/Bed: 023C/023C  Code Status   Code Status: Prior  Home/SNF/Other Home Patient oriented to: self, place, time and situation Is this baseline? Yes   Triage Complete: Triage complete  Chief Complaint cp  Triage Note Pt states he started to have CP 2 hrs PTA.  Sitting when pain began nothing makes it worse or better.  A&Ox4, ambulatory to triage.    Allergies Allergies  Allergen Reactions  . Penicillins Other (See Comments)    Has patient had a PCN reaction causing immediate rash, facial/tongue/throat swelling, SOB or lightheadedness with hypotension: Unknown Has patient had a PCN reaction causing severe rash involving mucus membranes or skin necrosis: No Has patient had a PCN reaction that required hospitalization: No Has patient had a PCN reaction occurring within the last 10 years: No (childhood reaction) If all of the above answers are "NO", then may proceed with Cephalosporin use. Unknown childhood allergic reaction    Level of Care/Admitting Diagnosis ED Disposition    ED Disposition Condition Comment   Admit  [facility][id number]  Level of Care: Telemetry Medical [104]  I expect the patient will be discharged within 24 hours: No (not a candidate for 5C-Observation unit)  Diagnosis: CAP (community acquired pneumonia) [191478]) [340684]  Admitting Physician: Eduard ClosKAKRAKANDY, ARSHAD N [phone][[id number]]  Attending Physician: Eduard ClosKAKRAKANDY, ARSHAD N [[id number]]  PT Class (Do Not Modify): Observation [104]  PT Acc Code (Do Not Modify): Observation [10022]       B Medical/Surgery History Past Medical History:  Diagnosis Date  . Anxiety   . COPD (chronic obstructive pulmonary disease) (HCC)   . Emphysema   . ETOH abuse   . Pneumonia    "several times" (07/09/2017)  . Polysubstance abuse (HCC)    THC, Cocaine  . Tuberculosis    "when I  was little" (07/09/2017)   Past Surgical History:  Procedure Laterality Date  . ANTERIOR CERVICAL DECOMP/DISCECTOMY FUSION    . APPENDECTOMY    . BACK SURGERY    . INGUINAL HERNIA REPAIR Left   . ORBITAL FRACTURE SURGERY Right    "I've got a plate over my right eye"  . SHOULDER ARTHROSCOPY WITH ROTATOR CUFF REPAIR Left   . ULNAR NERVE REPAIR Right    "@ elbow"     A IV Location/Drains/Wounds Patient Lines/Drains/Airways Status   Active Line/Drains/Airways    Name:   Placement date:   Placement time:   Site:   Days:   Peripheral IV 02/10/19 Right Antecubital   02/10/19    1742    Antecubital   less than 1          Intake/Output Last 24 hours  Intake/Output Summary (Last 24 hours) at 02/10/2019 2016 Last data filed at 02/10/2019 1854 Gross per 24 hour  Intake 1000 ml  Output -  Net 1000 ml    Labs/Imaging Results for orders placed or performed during the hospital encounter of 02/10/19 (from the past 48 hour(s))  Basic metabolic panel     Status: Abnormal   Collection Time: 02/10/19  3:40 PM  Result Value Ref Range   Sodium 132 (L) 135 - 145 mmol/L   Potassium 3.5 3.5 - 5.1 mmol/L   Chloride 99 98 - 111 mmol/L   CO2 21 (L) 22 - 32 mmol/L   Glucose, Bld 122 (H) 70 - 99 mg/dL  BUN 9 6 - 20 mg/dL   Creatinine, Ser 1.61 0.61 - 1.24 mg/dL   Calcium 8.9 8.9 - 09.6 mg/dL   GFR calc non Af Amer >60 >60 mL/min   GFR calc Af Amer >60 >60 mL/min   Anion gap 12 5 - 15    Comment: Performed at [facility], [address] [place], [place] [address]  CBC     Status: Abnormal   Collection Time: 02/10/19  3:40 PM  Result Value Ref Range   WBC 14.9 (H) 4.0 - 10.5 K/uL   RBC 5.46 4.22 - 5.81 MIL/uL   Hemoglobin 17.4 (H) 13.0 - 17.0 g/dL   HCT 98.1 19.1 - 47.8 %   MCV 94.0 80.0 - 100.0 fL   MCH 31.9 26.0 - 34.0 pg   MCHC 33.9 30.0 - 36.0 g/dL   RDW 29.5 62.1 - 30.8 %   Platelets 221 150 - 400 K/uL   nRBC 0.0 0.0 - 0.2 %    Comment: Performed at [facility]  [facility], [address], [place], [place] [address]  I-stat troponin, ED     Status: None   Collection Time: 02/10/19  3:59 PM  Result Value Ref Range   Troponin i, poc 0.01 0.00 - 0.08 ng/mL   Comment 3            Comment: Due to the release kinetics of cTnI, a negative result within the first hours of the onset of symptoms does not rule out myocardial infarction with certainty. If myocardial infarction is still suspected, repeat the test at appropriate intervals.    Dg Chest 2 View  Result Date: 02/10/2019 CLINICAL DATA:  Chest pain.  Fever.  Midline chest pain. EXAM: CHEST - 2 VIEW COMPARISON:  09/28/2017, 07/17/2017, 07/09/2017 and 03/15/2017 FINDINGS: There is a small focal area of increased density at the inferior aspect of the right hilum medially which appears to be in the right middle lobe on the lateral view. There is chronic accentuation of the interstitial markings with emphysematous blebs at the left apex. Heart size is normal. Chronic slight prominence of the main pulmonary arteries. No significant bone abnormality. IMPRESSION: Small focal area of infiltrate in the medial aspect of the right middle lobe. This is superimposed on chronic interstitial and obstructive lung disease. Electronically Signed   By: Francene Boyers M.D.   On: 02/10/2019 16:14    Pending Labs Unresulted Labs (From admission, onward)    Start     Ordered   02/10/19 1942  Respiratory Panel by PCR  (Respiratory virus panel with precautions)  Once,   R     02/10/19 1941   02/10/19 1917  Lactic acid, plasma  Now then every 2 hours,   STAT     02/10/19 1917   02/10/19 1917  Blood culture (routine x 2)  BLOOD CULTURE X 2,   STAT     02/10/19 1917          Vitals/Pain Today's Vitals   02/10/19 1853 02/10/19 1854 02/10/19 1900 02/10/19 1915  BP: 122/85  127/87 117/88  Pulse: 97  90 91  Resp: (!) 21   17  Temp:      TempSrc:      SpO2:   92% 93%  Weight:      Height:      PainSc:  7       Isolation  Precautions Droplet precaution  Medications Medications  cefTRIAXone (ROCEPHIN) 1 g in sodium  chloride 0.9 % 100 mL IVPB (1 g Intravenous New Bag/Given 02/10/19 1949)  azithromycin (ZITHROMAX) 500 mg in sodium chloride 0.9 % 250 mL IVPB (has no administration in time range)  sodium chloride flush (NS) 0.9 % injection 3 mL (3 mLs Intravenous Given 02/10/19 1743)  ibuprofen (ADVIL,MOTRIN) tablet 600 mg (600 mg Oral Given 02/10/19 1745)  ondansetron (ZOFRAN) injection 4 mg (4 mg Intravenous Given 02/10/19 1743)  lactated ringers bolus 1,000 mL (0 mLs Intravenous Stopped 02/10/19 1854)  cyclobenzaprine (FLEXERIL) tablet 10 mg (10 mg Oral Given 02/10/19 1928)    Mobility walks Low fall risk   Focused Assessments Pulmonary Assessment Handoff:  Lung sounds: Bilateral Breath Sounds: Diminished, Expiratory wheezes L Breath Sounds: Diminished, Expiratory wheezes R Breath Sounds: Clear, Diminished O2 Device: Nasal Cannula O2 Flow Rate (L/min): 2 L/min      R Recommendations: See Admitting Provider Note  Report given to:   Additional Notes:

## 2019-02-10 NOTE — ED Provider Notes (Signed)
[facility] EMERGENCY DEPARTMENT Provider Note   CSN: [id number] Arrival date & time: 02/10/19  1524    History   Chief Complaint Chief Complaint  Patient presents with  . Chest Pain    HPI Alejandro Ferguson is a 58 y.o. male with no significant past medical history who presents the emergency department complaining of intermittent chest pain associated with a cough for the past 1 week.  Patient states that he has had dry hacking cough now for 7 days with 1 subjective fever noted 2 days ago.  He states that he is had multiple sick contacts including his wife and coworker.  He states that his chest pain is located diffusely throughout his chest as well as his abdomen and back.  It is only present when coughing.  He describes it as a dull aching-like sensation.  He states that he has had decreased p.o. intake over the past few days as well with last real meal over 3 days ago.  He states that he has been nauseated and vomiting intermittently.  Patient had multiple episodes of pneumonia in the past which he states this feels eriely similar to.      Illness  Severity:  Severe Onset quality:  Gradual Duration:  1 week Timing:  Intermittent Progression:  Unchanged Chronicity:  New Associated symptoms: chest pain, cough, fever (subjective ), nausea, shortness of breath and vomiting   Associated symptoms: no abdominal pain, no ear pain, no rash and no sore throat     Past Medical History:  Diagnosis Date  . Anxiety   . COPD (chronic obstructive pulmonary disease) (HCC)   . Emphysema   . ETOH abuse   . Pneumonia    "several times" (07/09/2017)  . Polysubstance abuse (HCC)    THC, Cocaine  . Tuberculosis    "when I was little" (07/09/2017)    Patient Active Problem List   Diagnosis Date Noted  . CAP (community acquired pneumonia) 02/10/2019  . Pleuritic chest pain 02/10/2019  . Community acquired pneumonia 02/10/2019  . Intractable abdominal pain 07/09/2017  .  Pneumonia 08/25/2011  . Tobacco abuse 08/25/2011  . Hip pain, right 08/25/2011    Past Surgical History:  Procedure Laterality Date  . ANTERIOR CERVICAL DECOMP/DISCECTOMY FUSION    . APPENDECTOMY    . BACK SURGERY    . INGUINAL HERNIA REPAIR Left   . ORBITAL FRACTURE SURGERY Right    "I've got a plate over my right eye"  . SHOULDER ARTHROSCOPY WITH ROTATOR CUFF REPAIR Left   . ULNAR NERVE REPAIR Right    "@ elbow"        Home Medications    Prior to Admission medications   Medication Sig Start Date End Date Taking? Authorizing Provider  aspirin EC 325 MG tablet Take 325 mg by mouth daily as needed (pain).   Yes [provider]  doxycycline (VIBRAMYCIN) 100 MG capsule Take 1 capsule (100 mg total) by mouth 2 (two) times daily for 7 days. 02/10/19 02/17/19  Leonette Monarch, MD  morphine (MSIR) 15 MG tablet Take 1 tablet (15 mg total) by mouth every 4 (four) hours as needed for severe pain. Patient not taking: Reported on 09/28/2017 07/16/17   Melene Plan, DO  oxyCODONE-acetaminophen (PERCOCET) 7.5-325 MG tablet Take 1 tablet by mouth every 6 (six) hours as needed for severe pain. Patient not taking: Reported on 09/28/2017 07/11/17   Valentino Nose, MD  albuterol-ipratropium [facility]) [phone] MCG/ACT inhaler Inhale 2 puffs into the  lungs every 6 (six) hours as needed. Shortness of breath   02/16/12  [provider]    Family History Family History  Problem Relation Age of Onset  . Hypertension Mother   . Lung cancer Other     Social History Social History   Tobacco Use  . Smoking status: Former Smoker    Packs/day: 1.00    Years: 38.00    Pack years: 38.00    Types: Cigarettes  . Smokeless tobacco: Never Used  Substance Use Topics  . Alcohol use: Yes    Alcohol/week: 18.0 standard drinks    Types: 18 Cans of beer per week    Comment: 07/09/2017 "maybe 18/ beersweek on average"  . Drug use: Yes    Types: Marijuana, Cocaine    Comment: 07/09/2017 "weed  weekly; powder q couple months"     Allergies   Penicillins   Review of Systems Review of Systems  Constitutional: Positive for fever (subjective ). Negative for chills.  HENT: Negative for ear pain and sore throat.   Eyes: Negative for pain and visual disturbance.  Respiratory: Positive for cough and shortness of breath.   Cardiovascular: Positive for chest pain. Negative for palpitations.  Gastrointestinal: Positive for nausea and vomiting. Negative for abdominal pain.  Genitourinary: Negative for dysuria and hematuria.  Musculoskeletal: Negative for arthralgias and back pain.  Skin: Negative for color change and rash.  Neurological: Negative for seizures and syncope.  All other systems reviewed and are negative.    Physical Exam Updated Vital Signs BP (!) 111/92 (BP Location: Left Arm)   Pulse 80   Temp 98.3 F (36.8 C) (Oral)   Resp 16   Ht 5\' 11"  (1.803 m)   Wt 83.7 kg Comment: scale C  SpO2 93%   BMI 25.73 kg/m   Physical Exam Vitals signs and nursing note reviewed.  Constitutional:      Appearance: He is well-developed.  HENT:     Head: Normocephalic and atraumatic.  Eyes:     Conjunctiva/sclera: Conjunctivae normal.  Neck:     Musculoskeletal: Neck supple.  Cardiovascular:     Rate and Rhythm: Regular rhythm. Tachycardia present.     Heart sounds: No murmur.  Pulmonary:     Effort: Pulmonary effort is normal. No respiratory distress.     Breath sounds: Normal breath sounds.  Abdominal:     Palpations: Abdomen is soft.     Tenderness: There is no abdominal tenderness.  Skin:    General: Skin is warm and dry.  Neurological:     Mental Status: He is alert.      ED Treatments / Results  Labs (all labs ordered are listed, but only abnormal results are displayed) Labs Reviewed  BASIC METABOLIC PANEL - Abnormal; Notable for the following components:      Result Value   Sodium 132 (*)    CO2 21 (*)    Glucose, Bld 122 (*)    All other  components within normal limits  CBC - Abnormal; Notable for the following components:   WBC 14.9 (*)    Hemoglobin 17.4 (*)    All other components within normal limits  RESPIRATORY PANEL BY PCR  CULTURE, BLOOD (ROUTINE X 2)  CULTURE, BLOOD (ROUTINE X 2)  EXPECTORATED SPUTUM ASSESSMENT W REFEX TO RESP CULTURE  GRAM STAIN  LACTIC ACID, PLASMA  STREP PNEUMONIAE URINARY ANTIGEN  HIV ANTIBODY (ROUTINE TESTING W REFLEX)  BASIC METABOLIC PANEL  LEGIONELLA PNEUMOPHILA SEROGP 1 UR AG  CBC  CREATININE, SERUM  HEPATIC FUNCTION PANEL  CBC WITH DIFFERENTIAL/PLATELET  RAPID URINE DRUG SCREEN, HOSP PERFORMED  TROPONIN I  TROPONIN I  TROPONIN I  LIPASE, BLOOD  I-STAT TROPONIN, ED    EKG EKG Interpretation  Date/Time:  Friday February 10 2019 15:47:05 EDT Ventricular Rate:  118 PR Interval:  124 QRS Duration: 92 QT Interval:  310 QTC Calculation: 434 R Axis:   82 Text Interpretation:  Sinus tachycardia with Premature atrial complexes Otherwise normal ECG Since prior ECG, rate has increased Confirmed by Alvira Monday ([id number]) on 02/10/2019 5:23:02 PM   Radiology Dg Chest 2 View  Result Date: 02/10/2019 CLINICAL DATA:  Chest pain.  Fever.  Midline chest pain. EXAM: CHEST - 2 VIEW COMPARISON:  09/28/2017, 07/17/2017, 07/09/2017 and 03/15/2017 FINDINGS: There is a small focal area of increased density at the inferior aspect of the right hilum medially which appears to be in the right middle lobe on the lateral view. There is chronic accentuation of the interstitial markings with emphysematous blebs at the left apex. Heart size is normal. Chronic slight prominence of the main pulmonary arteries. No significant bone abnormality. IMPRESSION: Small focal area of infiltrate in the medial aspect of the right middle lobe. This is superimposed on chronic interstitial and obstructive lung disease. Electronically Signed   By: Francene Boyers M.D.   On: 02/10/2019 16:14    Procedures Procedures  (including critical care time)  Medications Ordered in ED Medications  acetaminophen (TYLENOL) tablet 650 mg (has no administration in time range)    Or  acetaminophen (TYLENOL) suppository 650 mg (has no administration in time range)  ondansetron (ZOFRAN) tablet 4 mg (has no administration in time range)    Or  ondansetron (ZOFRAN) injection 4 mg (has no administration in time range)  cefTRIAXone (ROCEPHIN) 1 g in sodium chloride 0.9 % 100 mL IVPB (has no administration in time range)  azithromycin (ZITHROMAX) 500 mg in sodium chloride 0.9 % 250 mL IVPB (has no administration in time range)  enoxaparin (LOVENOX) injection 40 mg (has no administration in time range)  0.9 %  sodium chloride infusion ( Intravenous New Bag/Given 02/10/19 2321)  albuterol (PROVENTIL) (2.5 MG/3ML) 0.083% nebulizer solution 2.5 mg (has no administration in time range)  guaiFENesin-dextromethorphan (ROBITUSSIN DM) 100-10 MG/5ML syrup 5 mL (has no administration in time range)  sodium chloride flush (NS) 0.9 % injection 3 mL (3 mLs Intravenous Given 02/10/19 1743)  ibuprofen (ADVIL,MOTRIN) tablet 600 mg (600 mg Oral Given 02/10/19 1745)  ondansetron (ZOFRAN) injection 4 mg (4 mg Intravenous Given 02/10/19 1743)  lactated ringers bolus 1,000 mL (0 mLs Intravenous Stopped 02/10/19 1854)  cefTRIAXone (ROCEPHIN) 1 g in sodium chloride 0.9 % 100 mL IVPB (0 g Intravenous Stopped 02/10/19 2023)  azithromycin (ZITHROMAX) 500 mg in sodium chloride 0.9 % 250 mL IVPB (0 mg Intravenous Stopped 02/10/19 2142)  cyclobenzaprine (FLEXERIL) tablet 10 mg (10 mg Oral Given 02/10/19 1928)  morphine 2 MG/ML injection 1 mg (1 mg Intravenous Given 02/10/19 2141)     Initial Impression / Assessment and Plan / ED Course  I have reviewed the triage vital signs and the nursing notes.  Pertinent labs & imaging results that were available during my care of the patient were reviewed by me and considered in my medical decision making (see chart for  details).       Patient is a 58 year old male with extensive past smoking history as well as multiple prior episodes of pneumonia, who presents the  emergency department complaining of nonproductive hacking cough for the past 1 week with subjective fevers, sick contacts, and chest wall pain upon coughing.  On initial evaluation the patient he was afebrile, slightly tachycardic, normotensive, satting 98% on room air, and in no significant respiratory distress.  Physical exam as detailed above which was remarkable for mild reproducible chest wall tenderness to palpation.  Lungs were largely clear to auscultation bilaterally with no wheezing, rhonchi, or rales.  Equal pulses throughout.  Patient's clinical picture concerning for infectious etiology such as viral URI or pneumonia.  CBC, BMP, and point-of-care troponin were ordered via the triage process.  EKG was obtained which showed sinus tachycardia at 118 bpm, normal intervals, no evidence of ST or T wave abnormalities concerning for acute ischemia.  CBC remarkable for leukocytosis of 14.9.  Bolick panel with bicarb of 21 however glucose of 122 and normal anion gap reassuring.  Troponin negative.  Chest x-ray showing small focal area of infiltrate in the middle aspect of the right middle lobe.  Alternative causes of emergent chest pain such as ACS, pulmonary embolism, pneumothorax, or esophageal rupture are considered unlikely at this time and these would be inconsistent with history and physical exam findings.  Patient symptoms secondary to community acquired pneumonia.  Patient had oxygen desaturation while in the ED. I feel patient warrants inpatient treatment at this time given oxygen requirement. Blood cultures were obtained and patient treated with IV rocephin and azithromycin. Case discussed with hospitalist who are in agreement with admission at this time for acute hypoxic respiratory failure secondary to community acquired pneumonia.  Patient was in stable condition at time of admission.   Final Clinical Impressions(s) / ED Diagnoses   Final diagnoses:  Community acquired pneumonia of right lower lobe of lung [facility])    ED Discharge Orders         Ordered    doxycycline (VIBRAMYCIN) 100 MG capsule  2 times daily     02/10/19 1729           Leonette Monarch, MD 02/10/19 2352    Alvira Monday, MD 02/13/19 2326

## 2019-02-10 NOTE — H&P (Signed)
History and Physical    Alejandro Ferguson XBD:[id number] DOB: [birth year] DOA: 02/10/2019  PCP: Patient, No Pcp Per  Patient coming from: Home.  Chief Complaint: Chest pain.  Shortness of breath.  HPI: Alejandro Ferguson is a 58 y.o. male with history of tobacco abuse quit 6 months ago drinks alcohol about 3 times a week presents to the ER because of chest pain shortness of breath and abdominal pain.  Patient has been having the symptoms for last 1 week.  Chest pain is pleuritic in nature mostly substernal when he takes deep breath or coughs.  Today started radiating to left arm at this point he considered to come to the ER.  Associated with this abdominal pain mostly when coughing.  Has been having at least 2-3 episodes of vomiting denies any diarrhea denies any blood in the vomitus.  Has been having cough unable to produce any sputum.  Denies any recent sick contacts or travel.  ED Course: In the ER chest x-ray shows features concerning for pneumonia.  EKG was showing normal sinus rhythm.  Troponin was negative.  WBC count was 14.9.  Lactic acid 0.8.  Given the symptoms and patient be hypoxic patient admitted for further observation for pneumonia.  Abdomen appears mildly tender on exam.  Review of Systems: As per HPI, rest all negative.   Past Medical History:  Diagnosis Date   Anxiety    COPD (chronic obstructive pulmonary disease) (HCC)    Emphysema    ETOH abuse    Pneumonia    "several times" (07/09/2017)   Polysubstance abuse (HCC)    THC, Cocaine   Tuberculosis    "when I was little" (07/09/2017)    Past Surgical History:  Procedure Laterality Date   ANTERIOR CERVICAL DECOMP/DISCECTOMY FUSION     APPENDECTOMY     BACK SURGERY     INGUINAL HERNIA REPAIR Left    ORBITAL FRACTURE SURGERY Right    "I've got a plate over my right eye"   SHOULDER ARTHROSCOPY WITH ROTATOR CUFF REPAIR Left    ULNAR NERVE REPAIR Right    "@ elbow"     reports that he has quit smoking. His  smoking use included cigarettes. He has a 38.00 pack-year smoking history. He has never used smokeless tobacco. He reports current alcohol use of about 18.0 standard drinks of alcohol per week. He reports current drug use. Drugs: Marijuana and Cocaine.  Allergies  Allergen Reactions   Penicillins Other (See Comments)    Has patient had a PCN reaction causing immediate rash, facial/tongue/throat swelling, SOB or lightheadedness with hypotension: Unknown Has patient had a PCN reaction causing severe rash involving mucus membranes or skin necrosis: No Has patient had a PCN reaction that required hospitalization: No Has patient had a PCN reaction occurring within the last 10 years: No (childhood reaction) If all of the above answers are "NO", then may proceed with Cephalosporin use. Unknown childhood allergic reaction    Family History  Problem Relation Age of Onset   Hypertension Mother    Lung cancer Other     Prior to Admission medications   Medication Sig Start Date End Date Taking? Authorizing Provider  aspirin EC 325 MG tablet Take 325 mg by mouth daily as needed (pain).   Yes [provider]  doxycycline (VIBRAMYCIN) 100 MG capsule Take 1 capsule (100 mg total) by mouth 2 (two) times daily for 7 days. 02/10/19 02/17/19  Leonette Monarch, MD  morphine (MSIR) 15 MG tablet  Take 1 tablet (15 mg total) by mouth every 4 (four) hours as needed for severe pain. Patient not taking: Reported on 09/28/2017 07/16/17   Melene Plan, DO  oxyCODONE-acetaminophen (PERCOCET) 7.5-325 MG tablet Take 1 tablet by mouth every 6 (six) hours as needed for severe pain. Patient not taking: Reported on 09/28/2017 07/11/17   Valentino Nose, MD  albuterol-ipratropium [facility]) [phone] MCG/ACT inhaler Inhale 2 puffs into the lungs every 6 (six) hours as needed. Shortness of breath   02/16/12  [provider]    Physical Exam: Vitals:   02/10/19 1915 02/10/19 2015 02/10/19 2030 02/10/19 2103  BP:  117/88  123/86 (!) 111/92  Pulse: 91  84 80  Resp: 17   16  Temp:    98.3 F (36.8 C)  TempSrc:    Oral  SpO2: 93% 94% 93% 93%  Weight:    83.7 kg  Height:     (1.803 m)      Constitutional: Moderately built and nourished. Vitals:   02/10/19 1915 02/10/19 2015 02/10/19 2030 02/10/19 2103  BP: 117/88  123/86 (!) 111/92  Pulse: 91  84 80  Resp: 17   16  Temp:    98.3 F (36.8 C)  TempSrc:    Oral  SpO2: 93% 94% 93% 93%  Weight:    83.7 kg  Height:     (1.803 m)   Eyes: Anicteric no pallor. ENMT: No discharge from the ears eyes nose and mouth. Neck: No mass felt.  No neck rigidity. Respiratory: No rhonchi or crepitations. Cardiovascular: S1-S2 heard. Abdomen: Soft epigastric tenderness present no guarding or rigidity no rebound tenderness. Musculoskeletal: No edema. Skin: No rash. Neurologic: Alert awake oriented to time place and person.  Moves all extremities. Psychiatric: Appears normal per normal affect.   Labs on Admission: I have personally reviewed following labs and imaging studies  CBC: Recent Labs  Lab 02/10/19 1540  WBC 14.9*  HGB 17.4*  HCT 51.3  MCV 94.0  PLT 221   Basic Metabolic Panel: Recent Labs  Lab 02/10/19 1540  NA 132*  K 3.5  CL 99  CO2 21*  GLUCOSE 122*  BUN 9  CREATININE 0.99  CALCIUM 8.9   GFR: Estimated Creatinine Clearance: 86.6 mL/min (by C-G formula based on SCr of 0.99 mg/dL). Liver Function Tests: No results for input(s): AST, ALT, ALKPHOS, BILITOT, PROT, ALBUMIN in the last 168 hours. No results for input(s): LIPASE, AMYLASE in the last 168 hours. No results for input(s): AMMONIA in the last 168 hours. Coagulation Profile: No results for input(s): INR, PROTIME in the last 168 hours. Cardiac Enzymes: No results for input(s): CKTOTAL, CKMB, CKMBINDEX, TROPONINI in the last 168 hours. BNP (last 3 results) No results for input(s): PROBNP in the last 8760 hours. HbA1C: No results for input(s): HGBA1C in  the last 72 hours. CBG: No results for input(s): GLUCAP in the last 168 hours. Lipid Profile: No results for input(s): CHOL, HDL, LDLCALC, TRIG, CHOLHDL, LDLDIRECT in the last 72 hours. Thyroid Function Tests: No results for input(s): TSH, T4TOTAL, FREET4, T3FREE, THYROIDAB in the last 72 hours. Anemia Panel: No results for input(s): VITAMINB12, FOLATE, FERRITIN, TIBC, IRON, RETICCTPCT in the last 72 hours. Urine analysis:    Component Value Date/Time   COLORURINE YELLOW 07/15/2017 2231   APPEARANCEUR CLEAR 07/15/2017 2231   LABSPEC 1.014 07/15/2017 2231   PHURINE 6.0 07/15/2017 2231   GLUCOSEU NEGATIVE 07/15/2017 2231   HGBUR NEGATIVE 07/15/2017 2231   BILIRUBINUR NEGATIVE  07/15/2017 2231   KETONESUR NEGATIVE 07/15/2017 2231   PROTEINUR NEGATIVE 07/15/2017 2231   UROBILINOGEN 1.0 06/22/2015 2000   NITRITE NEGATIVE 07/15/2017 2231   LEUKOCYTESUR NEGATIVE 07/15/2017 2231   Sepsis Labs: @LABRCNTIP (procalcitonin:4,lacticidven:4) )No results found for this or any previous visit (from the past 240 hour(s)).   Radiological Exams on Admission: Dg Chest 2 View  Result Date: 02/10/2019 CLINICAL DATA:  Chest pain.  Fever.  Midline chest pain. EXAM: CHEST - 2 VIEW COMPARISON:  09/28/2017, 07/17/2017, 07/09/2017 and 03/15/2017 FINDINGS: There is a small focal area of increased density at the inferior aspect of the right hilum medially which appears to be in the right middle lobe on the lateral view. There is chronic accentuation of the interstitial markings with emphysematous blebs at the left apex. Heart size is normal. Chronic slight prominence of the main pulmonary arteries. No significant bone abnormality. IMPRESSION: Small focal area of infiltrate in the medial aspect of the right middle lobe. This is superimposed on chronic interstitial and obstructive lung disease. Electronically Signed   By: Francene Boyers M.D.   On: 02/10/2019 16:14    EKG: Independently reviewed.  Normal sinus  rhythm.  Assessment/Plan Principal Problem:   CAP (community acquired pneumonia) Active Problems:   Pleuritic chest pain   Community acquired pneumonia    1. Community-acquired pneumonia -patient symptoms are concerning for pneumonia given the x-ray findings and patient symptoms.  Patient has been placed on empiric antibiotics.  Check respiratory viral panel urine for Legionella strep antigen sputum cultures and blood cultures. 2. Pleuritic type of chest pain could be from pneumonia.  However given the history of tobacco abuse we will cycle cardiac markers.  3. Abdominal pain with nausea vomiting -abdomen is mildly tender in the epigastric area.  Will check LFTs lipase and closely observe for now.  LFTs and lipase elevated may need to get further imaging. 4. Patient drinks alcohol at least 3-4 times a week.  Closely observe.  Keep patient on thiamine for now. 5. Quit tobacco 6 months ago.  Chart states that patient had drug abuse.  Check urine drug screen.   DVT prophylaxis: Lovenox. Code Status: Full code. Family Communication: Discussed with patient. Disposition Plan: Home. Consults called: None. Admission status: Observation.   Eduard Clos MD [facility] Pager [phone].  If 7PM-7AM, please contact night-coverage [email or web address] Password [facility]  02/10/2019, 10:41 PM   \

## 2019-02-10 NOTE — ED Triage Notes (Signed)
Pt states he started to have CP 2 hrs PTA.  Sitting when pain began nothing makes it worse or better.  A&Ox4, ambulatory to triage.

## 2019-02-11 ENCOUNTER — Other Ambulatory Visit: Payer: Self-pay

## 2019-02-11 DIAGNOSIS — J441 Chronic obstructive pulmonary disease with (acute) exacerbation: Secondary | ICD-10-CM

## 2019-02-11 DIAGNOSIS — J189 Pneumonia, unspecified organism: Principal | ICD-10-CM

## 2019-02-11 LAB — HEPATIC FUNCTION PANEL
ALT: 28 U/L (ref 0–44)
AST: 38 U/L (ref 15–41)
Albumin: 3.3 g/dL — ABNORMAL LOW (ref 3.5–5.0)
Alkaline Phosphatase: 68 U/L (ref 38–126)
Bilirubin, Direct: 0.2 mg/dL (ref 0.0–0.2)
Indirect Bilirubin: 0.7 mg/dL (ref 0.3–0.9)
Total Bilirubin: 0.9 mg/dL (ref 0.3–1.2)
Total Protein: 6.4 g/dL — ABNORMAL LOW (ref 6.5–8.1)

## 2019-02-11 LAB — CBC WITH DIFFERENTIAL/PLATELET
Abs Immature Granulocytes: 0.1 10*3/uL — ABNORMAL HIGH (ref 0.00–0.07)
Basophils Absolute: 0 10*3/uL (ref 0.0–0.1)
Basophils Relative: 0 %
Eosinophils Absolute: 0 10*3/uL (ref 0.0–0.5)
Eosinophils Relative: 0 %
HCT: 47.8 % (ref 39.0–52.0)
Hemoglobin: 16 g/dL (ref 13.0–17.0)
Immature Granulocytes: 1 %
Lymphocytes Relative: 16 %
Lymphs Abs: 1.6 10*3/uL (ref 0.7–4.0)
MCH: 31.5 pg (ref 26.0–34.0)
MCHC: 33.5 g/dL (ref 30.0–36.0)
MCV: 94.1 fL (ref 80.0–100.0)
Monocytes Absolute: 1.1 10*3/uL — ABNORMAL HIGH (ref 0.1–1.0)
Monocytes Relative: 11 %
NRBC: 0 % (ref 0.0–0.2)
Neutro Abs: 7.5 10*3/uL (ref 1.7–7.7)
Neutrophils Relative %: 72 %
Platelets: 201 10*3/uL (ref 150–400)
RBC: 5.08 MIL/uL (ref 4.22–5.81)
RDW: 12.9 % (ref 11.5–15.5)
WBC: 10.4 10*3/uL (ref 4.0–10.5)

## 2019-02-11 LAB — STREP PNEUMONIAE URINARY ANTIGEN: Strep Pneumo Urinary Antigen: NEGATIVE

## 2019-02-11 LAB — BASIC METABOLIC PANEL
Anion gap: 11 (ref 5–15)
BUN: 9 mg/dL (ref 6–20)
CO2: 25 mmol/L (ref 22–32)
Calcium: 8.6 mg/dL — ABNORMAL LOW (ref 8.9–10.3)
Chloride: 101 mmol/L (ref 98–111)
Creatinine, Ser: 0.83 mg/dL (ref 0.61–1.24)
Glucose, Bld: 103 mg/dL — ABNORMAL HIGH (ref 70–99)
Potassium: 3.5 mmol/L (ref 3.5–5.1)
Sodium: 137 mmol/L (ref 135–145)

## 2019-02-11 LAB — HIV ANTIBODY (ROUTINE TESTING W REFLEX): HIV Screen 4th Generation wRfx: NONREACTIVE

## 2019-02-11 LAB — RAPID URINE DRUG SCREEN, HOSP PERFORMED
Amphetamines: NOT DETECTED
Barbiturates: NOT DETECTED
Benzodiazepines: NOT DETECTED
Cocaine: NOT DETECTED
Opiates: POSITIVE — AB
Tetrahydrocannabinol: NOT DETECTED

## 2019-02-11 LAB — CBC
HCT: 47.7 % (ref 39.0–52.0)
Hemoglobin: 16.4 g/dL (ref 13.0–17.0)
MCH: 31.7 pg (ref 26.0–34.0)
MCHC: 34.4 g/dL (ref 30.0–36.0)
MCV: 92.1 fL (ref 80.0–100.0)
Platelets: 203 10*3/uL (ref 150–400)
RBC: 5.18 MIL/uL (ref 4.22–5.81)
RDW: 12.8 % (ref 11.5–15.5)
WBC: 11.4 10*3/uL — ABNORMAL HIGH (ref 4.0–10.5)
nRBC: 0 % (ref 0.0–0.2)

## 2019-02-11 LAB — CREATININE, SERUM
CREATININE: 0.95 mg/dL (ref 0.61–1.24)
GFR calc Af Amer: >60 mL/min (ref >60)

## 2019-02-11 LAB — LIPASE, BLOOD: LIPASE: 56 U/L — AB (ref 11–51)

## 2019-02-11 LAB — TROPONIN I
Troponin I: <0.03 ng/mL (ref <0.03)
Troponin I: <0.03 ng/mL (ref <0.03)
Troponin I: <0.03 ng/mL (ref <0.03)

## 2019-02-11 MED ORDER — MORPHINE SULFATE (PF) 2 MG/ML IV SOLN
2.0000 mg | Freq: Once | INTRAVENOUS | Status: AC
  Administered 2019-02-11: 2 mg via INTRAVENOUS
  Filled 2019-02-11: qty 1

## 2019-02-11 MED ORDER — INFLUENZA VAC SPLIT QUAD 0.5 ML IM SUSY
0.5000 mL | PREFILLED_SYRINGE | INTRAMUSCULAR | Status: AC
  Administered 2019-02-12: 0.5 mL via INTRAMUSCULAR
  Filled 2019-02-11: qty 0.5

## 2019-02-11 MED ORDER — PNEUMOCOCCAL VAC POLYVALENT 25 MCG/0.5ML IJ INJ
0.5000 mL | INJECTION | INTRAMUSCULAR | Status: AC
  Administered 2019-02-12: 0.5 mL via INTRAMUSCULAR
  Filled 2019-02-11: qty 0.5

## 2019-02-11 NOTE — Progress Notes (Signed)
RN received ED report that pt was only having chest pain when coughing. Pt endorsed chest pain only when coughing upon admission to this unit. Dr. Kakrakandy came to unit shortly after admission to assess pt. Pt reported to MD that he has constant 4/10 chest pain at rest and 7/10 chest pain when coughing. EKG negative and Troponin negative. MD ordered Morphine IV x1. Upon reassessment, pt reports "chest pain is good" but rates it 4/10 when RN asked pt for numerical pain rating. Pt asleep upon later reassessment.  

## 2019-02-11 NOTE — Progress Notes (Addendum)
Pt now complaining of 4/10 chest pain. MD paged. Orders received for Morphine IV. Next troponin scheduled for 0500.    Pt asleep when RN entered room with Morphine. Will continue to monitor.

## 2019-02-11 NOTE — Progress Notes (Signed)
PROGRESS NOTE    RIPKEN MONTERROSA  GMW:[id number] DOB: [birth year] DOA: 02/10/2019 PCP: Patient, No Pcp Per    Brief Narrative:  BURNETTE GARVIE is a 58 y.o. male with history of tobacco abuse quit 6 months ago drinks alcohol about 3 times a week presents to the ER because of chest pain shortness of breath and abdominal pain.  Patient has been having the symptoms for last 1 week.  Chest pain is pleuritic in nature mostly substernal when he takes deep breath or coughs.  Today started radiating to left arm at this point he considered to come to the ER.  Associated with this abdominal pain mostly when coughing.  Has been having at least 2-3 episodes of vomiting denies any diarrhea denies any blood in the vomitus.  Has been having cough unable to produce any sputum.  Denies any recent sick contacts or travel.  ED Course: In the ER chest x-ray shows features concerning for pneumonia.  EKG was showing normal sinus rhythm.  Troponin was negative.  WBC count was 14.9.  Lactic acid 0.8.  Given the symptoms and patient be hypoxic patient admitted for further observation for pneumonia.  Abdomen appears mildly tender on exam. Assessment & Plan:   Principal Problem:   CAP (community acquired pneumonia) Active Problems:   Pneumonia   Pleuritic chest pain   Community acquired pneumonia  ##Pneumonia community-acquired -Continue with Rocephin, Zithromax  ##COPD exacerbation -Continue the duo nebs, Solu-Medrol, antibiotics  ##Abdominal pain -Musculoskeletal pain from coughing -Continue the Toradol and follow-up  ##Chest pain -Pleuritic -Cardiac enzymes x3-  neg -Improved  ##Alcohol use -Counseling done -Keep the patient on thiamine   DVT prophylaxis: Lovenox  code Status: (Full Family Communication: No family members present.  Discussed with patient Disposition Plan: Plan to discharge patient in 1 to 2 days    Antimicrobials:  Rocephin 02/10/2019 Azithromycin 02/10/2019  Subjective: Has  cough, shortness of breath.  Complaining of abdominal pain with coughing, moving Objective: Vitals:   02/10/19 2103 02/11/19 0247 02/11/19 0730 02/11/19 1147  BP: (!) 111/92  114/84 117/84  Pulse: 80  87 85  Resp: 16  20 20   Temp: 98.3 F (36.8 C)  98.3 F (36.8 C) 98.2 F (36.8 C)  TempSrc: Oral  Oral Oral  SpO2: 93%  91% 90%  Weight: 83.7 kg 83 kg    Height: 5\' 11"  (1.803 m)       Intake/Output Summary (Last 24 hours) at 02/11/2019 1345 Last data filed at 02/11/2019 1148 Gross per 24 hour  Intake 1891.95 ml  Output 1850 ml  Net 41.95 ml   Filed Weights   02/10/19 1749 02/10/19 2103 02/11/19 0247  Weight: 86.2 kg 83.7 kg 83 kg    Examination:  General exam: Appears calm and comfortable  Respiratory system: Bilateral good air entry.  Has occasional wheezing, crackles.  Long expiratory phase Cardiovascular system: S1 & S2 heard, RRR. No JVD, murmurs, rubs, gallops or clicks. No pedal edema. Gastrointestinal system: Abdomen is nondistended, soft and mild tenderness. No organomegaly or masses felt. Normal bowel sounds heard. Central nervous system: Alert and oriented. No focal neurological deficits. Extremities: Symmetric 5 x 5 power. Skin: No rashes, lesions or ulcers Psychiatry: Judgement and insight appear normal. Mood & affect appropriate.     Data Reviewed: I have personally reviewed following labs and imaging studies  CBC: Recent Labs  Lab 02/10/19 1540 02/10/19 2252 02/11/19 0458  WBC 14.9* 11.4* 10.4  NEUTROABS  --   --  7.5  HGB 17.4* 16.4 16.0  HCT 51.3 47.7 47.8  MCV 94.0 92.1 94.1  PLT 221 203 201   Basic Metabolic Panel: Recent Labs  Lab 02/10/19 1540 02/10/19 2252 02/11/19 0458  NA 132*  --  137  K 3.5  --  3.5  CL 99  --  101  CO2 21*  --  25  GLUCOSE 122*  --  103*  BUN 9  --  9  CREATININE 0.99 0.95 0.83  CALCIUM 8.9  --  8.6*   GFR: Estimated Creatinine Clearance: 103.3 mL/min (by C-G formula based on SCr of 0.83 mg/dL). Liver  Function Tests: Recent Labs  Lab 02/10/19 2252  AST 38  ALT 28  ALKPHOS 68  BILITOT 0.9  PROT 6.4*  ALBUMIN 3.3*   Recent Labs  Lab 02/10/19 2252  LIPASE 56*   No results for input(s): AMMONIA in the last 168 hours. Coagulation Profile: No results for input(s): INR, PROTIME in the last 168 hours. Cardiac Enzymes: Recent Labs  Lab 02/10/19 2252 02/11/19 0458 02/11/19 1056  TROPONINI <0.03 <0.03 <0.03   BNP (last 3 results) No results for input(s): PROBNP in the last 8760 hours. HbA1C: No results for input(s): HGBA1C in the last 72 hours. CBG: No results for input(s): GLUCAP in the last 168 hours. Lipid Profile: No results for input(s): CHOL, HDL, LDLCALC, TRIG, CHOLHDL, LDLDIRECT in the last 72 hours. Thyroid Function Tests: No results for input(s): TSH, T4TOTAL, FREET4, T3FREE, THYROIDAB in the last 72 hours. Anemia Panel: No results for input(s): VITAMINB12, FOLATE, FERRITIN, TIBC, IRON, RETICCTPCT in the last 72 hours. Sepsis Labs: Recent Labs  Lab 02/10/19 1940  LATICACIDVEN 0.8    Recent Results (from the past 240 hour(s))  Respiratory Panel by PCR     Status: None   Collection Time: 02/10/19  8:22 PM  Result Value Ref Range Status   Adenovirus NOT DETECTED NOT DETECTED Final   Coronavirus 229E NOT DETECTED NOT DETECTED Final    Comment: (NOTE) The Coronavirus on the Respiratory Panel, DOES NOT test for the novel  Coronavirus (2019 nCoV)    Coronavirus HKU1 NOT DETECTED NOT DETECTED Final   Coronavirus NL63 NOT DETECTED NOT DETECTED Final   Coronavirus OC43 NOT DETECTED NOT DETECTED Final   Metapneumovirus NOT DETECTED NOT DETECTED Final   Rhinovirus / Enterovirus NOT DETECTED NOT DETECTED Final   Influenza A NOT DETECTED NOT DETECTED Final   Influenza B NOT DETECTED NOT DETECTED Final   Parainfluenza Virus 1 NOT DETECTED NOT DETECTED Final   Parainfluenza Virus 2 NOT DETECTED NOT DETECTED Final   Parainfluenza Virus 3 NOT DETECTED NOT DETECTED  Final   Parainfluenza Virus 4 NOT DETECTED NOT DETECTED Final   Respiratory Syncytial Virus NOT DETECTED NOT DETECTED Final   Bordetella pertussis NOT DETECTED NOT DETECTED Final   Chlamydophila pneumoniae NOT DETECTED NOT DETECTED Final   Mycoplasma pneumoniae NOT DETECTED NOT DETECTED Final    Comment: Performed at [facility], [address] [place], [place] [address]         Radiology Studies: Dg Chest 2 View  Result Date: 02/10/2019 CLINICAL DATA:  Chest pain.  Fever.  Midline chest pain. EXAM: CHEST - 2 VIEW COMPARISON:  09/28/2017, 07/17/2017, 07/09/2017 and 03/15/2017 FINDINGS: There is a small focal area of increased density at the inferior aspect of the right hilum medially which appears to be in the right middle lobe on the lateral view. There is chronic accentuation of the interstitial  markings with emphysematous blebs at the left apex. Heart size is normal. Chronic slight prominence of the main pulmonary arteries. No significant bone abnormality. IMPRESSION: Small focal area of infiltrate in the medial aspect of the right middle lobe. This is superimposed on chronic interstitial and obstructive lung disease. Electronically Signed   By: Francene Boyers M.D.   On: 02/10/2019 16:14        Scheduled Meds: . enoxaparin (LOVENOX) injection  40 mg Subcutaneous Q24H  . [START ON 02/12/2019] Influenza vac split quadrivalent PF  0.5 mL Intramuscular Tomorrow-1000  . [START ON 02/12/2019] pneumococcal 23 valent vaccine  0.5 mL Intramuscular Tomorrow-1000   Continuous Infusions: . sodium chloride 75 mL/hr at 02/10/19 2321  . azithromycin    . cefTRIAXone (ROCEPHIN)  IV       LOS: 0 days    Time spent: 35 minutes   Willett Lefeber, MD [facility] Pager [phone]  If 7PM-7AM, please contact night-coverage [email or web address] Password [email or web address] 02/11/2019, 1:45 PM

## 2019-02-11 NOTE — TOC Initial Note (Signed)
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 He is agreeable to go to [facility] for medical care [facility] in [place]).He is independent, no DME; At discharge, Attending MD please send scripts to the [facility] pharmacy ( Monday - Friday). CM will continue to follow for progression of care.  Expected Discharge Plan: Home/Self Care Barriers to Discharge: No Barriers Identified   Patient Goals and CMS Choice Patient states their goals for this hospitalization and ongoing recovery are:: stay out of the hospital CMS Medicare.gov Compare Post Acute Care list provided to:: Patient    Expected Discharge Plan and Services Expected Discharge Plan: Home/Self Care     Living arrangements for the past 2 months: Single Family Home                          Prior Living Arrangements/Services Living arrangements for the past 2 months: Single Family Home Lives with:: Spouse Patient language and need for interpreter reviewed:: No Do you feel safe going back to the place where you live?: Yes      Need for Family Participation in Patient Care: No (Comment) Care giver support system in place?: Yes (comment)   Criminal Activity/Legal Involvement Pertinent to Current Situation/Hospitalization: No - Comment as needed  Activities of Daily Living Home Assistive Devices/Equipment: None ADL Screening (condition at time of admission) Patient's cognitive ability adequate to safely complete daily activities?:  Yes Is the patient deaf or have difficulty hearing?: No Does the patient have difficulty seeing, even when wearing glasses/contacts?: No Does the patient have difficulty concentrating, remembering, or making decisions?: No Patient able to express need for assistance with ADLs?: Yes Does the patient have difficulty dressing or bathing?: No Independently performs ADLs?: Yes (appropriate for developmental age) Does the patient have difficulty walking or climbing stairs?: No Weakness of Legs: None Weakness of Arms/Hands: None  Permission Sought/Granted Permission sought to share information with : Case Manager Permission granted to share information with : Yes, Verbal Permission Granted  Share Information with NAME: Alejandro Ferguson (Spouse)  Permission granted to share info w AGENCY: [facility]         Emotional Assessment Appearance:: Appears older than stated age, Well-Groomed Attitude/Demeanor/Rapport: Gracious, Self-Confident Affect (typically observed): Accepting Orientation: : Oriented to Self, Oriented to  Time, Oriented to Place, Oriented to Situation Alcohol / Substance Use: Not Applicable Psych Involvement: No (comment)  Admission diagnosis:  Community acquired pneumonia of right lower lobe of lung (HCC) [J18.1] Patient Active Problem List   Diagnosis Date Noted  . CAP (community acquired pneumonia) 02/10/2019  . Pleuritic chest pain 02/10/2019  . Community acquired pneumonia 02/10/2019  . Intractable abdominal pain 07/09/2017  . Pneumonia 08/25/2011  . Tobacco abuse 08/25/2011  . Hip pain, right 08/25/2011   PCP:  Patient, No Pcp Per Pharmacy:   [id number] - [place], [place] - [address] [place] [address] Phone: [phone] Fax: [phone]     Social Determinants of Health (SDOH) Interventions    Readmission Risk Interventions  No flowsheet data found.

## 2019-02-12 LAB — LEGIONELLA PNEUMOPHILA SEROGP 1 UR AG: L. pneumophila Serogp 1 Ur Ag: NEGATIVE

## 2019-02-12 MED ORDER — PREDNISONE 10 MG PO TABS: 10.0000 mg | ORAL_TABLET | Freq: Every day | ORAL | 0 refills | Status: AC

## 2019-02-12 MED ORDER — GUAIFENESIN-DM 100-10 MG/5ML PO SYRP: 5.0000 mL | ORAL_SOLUTION | ORAL | 0 refills | Status: DC | PRN

## 2019-02-12 MED ORDER — CEFDINIR 300 MG PO CAPS: 300.0000 mg | ORAL_CAPSULE | Freq: Two times a day (BID) | ORAL | 0 refills | Status: AC

## 2019-02-12 MED ORDER — DOXYCYCLINE HYCLATE 100 MG PO CAPS: 100.0000 mg | ORAL_CAPSULE | Freq: Two times a day (BID) | ORAL | 0 refills | Status: AC

## 2019-02-12 NOTE — Discharge Summary (Signed)
Physician Discharge Summary  SHERAZ SACHAR MBT:[id number] DOB: 03/27/1961 DOA: 02/10/2019  PCP: Patient, No Pcp Per  Admit date: 02/10/2019 Discharge date: 02/12/2019  Admitted From: Home Disposition: Home  Recommendations for Outpatient Follow-up:  1. Follow up with PCP in 1-2 week  Home Health: None Equipment/Devices: None Discharge Condition: Stable CODE STATUS: Full code Diet recommendation: Regular diet  Brief/Interim Summary: 58 y.o.malewithhistory of tobacco abuse quit 6 months ago drinks alcohol about 3 times a week presents to the ER because of chest pain shortness of breath and abdominal pain. Patient has been having the symptoms for last 1 week. Chest pain is pleuritic in nature mostly substernal when he takes deep breath or coughs. Today started radiating to left arm at this point he considered to come to the ER. Associated with this abdominal pain mostly when coughing. Has been having at least 2-3 episodes of vomiting denies any diarrhea denies any blood in the vomitus. Has been having cough unable to produce any sputum. Denies any recent sick contacts or travel.  ED Course:In the ER chest x-ray shows features concerning for pneumonia. EKG was showing normal sinus rhythm. Troponin was negative. WBC count was 14.9. Lactic acid 0.8. Given the symptoms and patient be hypoxic patient admitted for further observation for pneumonia. Abdomen appears mildly tender on exam.  Patient is was admitted, treated for pneumonia, COPD exacerbation.  At this time cough is much better.  He complains of chest wall and abdominal pain secondary to bouts of coughing that he has had.  Patient is afebrile, not needing oxygen, feels well and would like to go home today.  His white count has improved, HIV screen negative, respiratory virus panel negative, and blood culture has been negative.  Discharge Diagnoses:  Principal Problem:   CAP (community acquired pneumonia) Active Problems:    Pneumonia   Pleuritic chest pain   Community acquired pneumonia  ##Pneumonia community-acquired: Continue oral antibiotics to complete the course and follow-up with PCP as outpatient.  Continue antitussives.  ##COPD exacerbation: Improved, not on oxygen, breath sounds are clear, will give few days of low dose  Steroids.  ##Abdominal pain/chest wall pain after coughing.  Continue antitussives, Tylenol.  Cardiac enzymes have been negative.  ##Alcohol TXM:IWOEHOZYYQ done.  No signs of withdrawal.  Discharge Instructions  Discharge Instructions    Diet - low sodium heart healthy   Complete by:  As directed    Discharge instructions   Complete by:  As directed    Please call call MD or return to ER for similar recurring problem, nausea/vomiting, uncontrolled pain, abdominal pain chest pain, shortness of breath, fever. Please follow-up your doctor as instructed in a week time and call the office for appointment. Please avoid alcohol, smoking, or any other illicit substance.   Increase activity slowly   Complete by:  As directed      Allergies as of 02/12/2019      Reactions   Penicillins Other (See Comments)   Has patient had a PCN reaction causing immediate rash, facial/tongue/throat swelling, SOB or lightheadedness with hypotension: Unknown Has patient had a PCN reaction causing severe rash involving mucus membranes or skin necrosis: No Has patient had a PCN reaction that required hospitalization: No Has patient had a PCN reaction occurring within the last 10 years: No (childhood reaction) If all of the above answers are "NO", then may proceed with Cephalosporin use. Unknown childhood allergic reaction      Medication List    TAKE these medications  aspirin EC 325 MG tablet Take 325 mg by mouth daily as needed (pain).   cefdinir 300 MG capsule Commonly known as:  OMNICEF Take 1 capsule (300 mg total) by mouth 2 (two) times daily for 3 days.   doxycycline 100 MG  capsule Commonly known as:  VIBRAMYCIN Take 1 capsule (100 mg total) by mouth 2 (two) times daily for 3 days.   guaiFENesin-dextromethorphan 100-10 MG/5ML syrup Commonly known as:  ROBITUSSIN DM Take 5 mLs by mouth every 4 (four) hours as needed for cough.   morphine 15 MG tablet Commonly known as:  MSIR Take 1 tablet (15 mg total) by mouth every 4 (four) hours as needed for severe pain.   oxyCODONE-acetaminophen 7.5-325 MG tablet Commonly known as:  PERCOCET Take 1 tablet by mouth every 6 (six) hours as needed for severe pain.   predniSONE 10 MG tablet Commonly known as:  DELTASONE Take 1 tablet (10 mg total) by mouth daily for 5 days.      Follow-up Information    Schedule an appointment as soon as possible for a visit  with [facility].   Why:  To establish PCP and follow up pneumonia Contact information: [address] [place] [address] [phone]         Allergies  Allergen Reactions  . Penicillins Other (See Comments)    Has patient had a PCN reaction causing immediate rash, facial/tongue/throat swelling, SOB or lightheadedness with hypotension: Unknown Has patient had a PCN reaction causing severe rash involving mucus membranes or skin necrosis: No Has patient had a PCN reaction that required hospitalization: No Has patient had a PCN reaction occurring within the last 10 years: No (childhood reaction) If all of the above answers are "NO", then may proceed with Cephalosporin use. Unknown childhood allergic reaction    Consultations: none  Procedures/Studies: Dg Chest 2 View  Result Date: 02/10/2019 CLINICAL DATA:  Chest pain.  Fever.  Midline chest pain. EXAM: CHEST - 2 VIEW COMPARISON:  09/28/2017, 07/17/2017, 07/09/2017 and 03/15/2017 FINDINGS: There is a small focal area of increased density at the inferior aspect of the right hilum medially which appears to be in the right middle lobe on the lateral  view. There is chronic accentuation of the interstitial markings with emphysematous blebs at the left apex. Heart size is normal. Chronic slight prominence of the main pulmonary arteries. No significant bone abnormality. IMPRESSION: Small focal area of infiltrate in the medial aspect of the right middle lobe. This is superimposed on chronic interstitial and obstructive lung disease. Electronically Signed   By: Francene Boyers M.D.   On: 02/10/2019 16:14   Subjective: Resting well. No fever or shortness of breath  Discharge Exam: Vitals:   02/12/19 0430 02/12/19 0747  BP: 123/84 (!) 119/98  Pulse: 75 82  Resp: 18 20  Temp: 98.1 F (36.7 C)   SpO2: 92% 92%   Vitals:   02/11/19 1644 02/11/19 2056 02/12/19 0430 02/12/19 0747  BP: 120/79 115/78 123/84 (!) 119/98  Pulse: 83 79 75 82  Resp: Temp: 98.1 F (36.7 C) 98.2 F (36.8 C) 98.1 F (36.7 C)   TempSrc: Oral Oral Oral   SpO2: 97% 91% 92% 92%  Weight:      Height:        General: Pt is alert, awake, not in acute distress Cardiovascular: RRR, S1/S2 +, no rubs, no gallops Respiratory: CTA bilaterally, no wheezing, no rhonchi Abdominal:  Soft, NT, ND, bowel sounds + Extremities: no edema, no cyanosis  The results of significant diagnostics from this hospitalization (including imaging, microbiology, ancillary and laboratory) are listed below for reference.     Microbiology: Recent Results (from the past 240 hour(s))  Blood culture (routine x 2)     Status: None (Preliminary result)   Collection Time: 02/10/19  7:37 PM  Result Value Ref Range Status   Specimen Description BLOOD RIGHT FOREARM  Final   Special Requests   Final    BOTTLES DRAWN AEROBIC AND ANAEROBIC Blood Culture adequate volume   Culture   Final    NO GROWTH 2 DAYS Performed at [facility], [address] [place], [place] [address]    Report Status PENDING  Incomplete  Blood culture (routine x 2)     Status: None (Preliminary result)    Collection Time: 02/10/19  7:47 PM  Result Value Ref Range Status   Specimen Description BLOOD LEFT FOREARM  Final   Special Requests   Final    BOTTLES DRAWN AEROBIC AND ANAEROBIC Blood Culture adequate volume   Culture   Final    NO GROWTH 2 DAYS Performed at [facility], [address] [place], [place] [address]    Report Status PENDING  Incomplete  Respiratory Panel by PCR     Status: None   Collection Time: 02/10/19  8:22 PM  Result Value Ref Range Status   Adenovirus NOT DETECTED NOT DETECTED Final   Coronavirus 229E NOT DETECTED NOT DETECTED Final    Comment: (NOTE) The Coronavirus on the Respiratory Panel, DOES NOT test for the novel  Coronavirus (2019 nCoV)    Coronavirus HKU1 NOT DETECTED NOT DETECTED Final   Coronavirus NL63 NOT DETECTED NOT DETECTED Final   Coronavirus OC43 NOT DETECTED NOT DETECTED Final   Metapneumovirus NOT DETECTED NOT DETECTED Final   Rhinovirus / Enterovirus NOT DETECTED NOT DETECTED Final   Influenza A NOT DETECTED NOT DETECTED Final   Influenza B NOT DETECTED NOT DETECTED Final   Parainfluenza Virus 1 NOT DETECTED NOT DETECTED Final   Parainfluenza Virus 2 NOT DETECTED NOT DETECTED Final   Parainfluenza Virus 3 NOT DETECTED NOT DETECTED Final   Parainfluenza Virus 4 NOT DETECTED NOT DETECTED Final   Respiratory Syncytial Virus NOT DETECTED NOT DETECTED Final   Bordetella pertussis NOT DETECTED NOT DETECTED Final   Chlamydophila pneumoniae NOT DETECTED NOT DETECTED Final   Mycoplasma pneumoniae NOT DETECTED NOT DETECTED Final    Comment: Performed at [facility], [address] [place], [place] [address]     Labs: BNP (last 3 results) No results for input(s): BNP in the last 8760 hours. Basic Metabolic Panel: Recent Labs  Lab 02/10/19 1540 02/10/19 2252 02/11/19 0458  NA 132*  --  137  K 3.5  --  3.5  CL 99  --  101  CO2 21*  --  25  GLUCOSE 122*  --  103*  BUN 9  --  9  CREATININE 0.99 0.95 0.83  CALCIUM 8.9  --   8.6*   Liver Function Tests: Recent Labs  Lab 02/10/19 2252  AST 38  ALT 28  ALKPHOS 68  BILITOT 0.9  PROT 6.4*  ALBUMIN 3.3*   Recent Labs  Lab 02/10/19 2252  LIPASE 56*   No results for input(s): AMMONIA in the last 168 hours. CBC: Recent Labs  Lab 02/10/19 1540 02/10/19 2252 02/11/19 0458  WBC 14.9* 11.4* 10.4  NEUTROABS  --   --  7.5  HGB 17.4* 16.4 16.0  HCT 51.3 47.7 47.8  MCV 94.0 92.1 94.1  PLT 221 203 201   Cardiac Enzymes: Recent Labs  Lab 02/10/19 2252 02/11/19 0458 02/11/19 1056  TROPONINI <0.03 <0.03 <0.03   BNP: Invalid input(s): POCBNP CBG: No results for input(s): GLUCAP in the last 168 hours. D-Dimer No results for input(s): DDIMER in the last 72 hours. Hgb A1c No results for input(s): HGBA1C in the last 72 hours. Lipid Profile No results for input(s): CHOL, HDL, LDLCALC, TRIG, CHOLHDL, LDLDIRECT in the last 72 hours. Thyroid function studies No results for input(s): TSH, T4TOTAL, T3FREE, THYROIDAB in the last 72 hours.  Invalid input(s): FREET3 Anemia work up No results for input(s): VITAMINB12, FOLATE, FERRITIN, TIBC, IRON, RETICCTPCT in the last 72 hours. Urinalysis    Component Value Date/Time   COLORURINE YELLOW 07/15/2017 2231   APPEARANCEUR CLEAR 07/15/2017 2231   LABSPEC 1.014 07/15/2017 2231   PHURINE 6.0 07/15/2017 2231   GLUCOSEU NEGATIVE 07/15/2017 2231   HGBUR NEGATIVE 07/15/2017 2231   BILIRUBINUR NEGATIVE 07/15/2017 2231   KETONESUR NEGATIVE 07/15/2017 2231   PROTEINUR NEGATIVE 07/15/2017 2231   UROBILINOGEN 1.0 06/22/2015 2000   NITRITE NEGATIVE 07/15/2017 2231   LEUKOCYTESUR NEGATIVE 07/15/2017 2231   Sepsis Labs Invalid input(s): PROCALCITONIN,  WBC,  LACTICIDVEN Microbiology Recent Results (from the past 240 hour(s))  Blood culture (routine x 2)     Status: None (Preliminary result)   Collection Time: 02/10/19  7:37 PM  Result Value Ref Range Status   Specimen Description BLOOD RIGHT FOREARM  Final    Special Requests   Final    BOTTLES DRAWN AEROBIC AND ANAEROBIC Blood Culture adequate volume   Culture   Final    NO GROWTH 2 DAYS Performed at [facility], [address] [place], [place] [address]    Report Status PENDING  Incomplete  Blood culture (routine x 2)     Status: None (Preliminary result)   Collection Time: 02/10/19  7:47 PM  Result Value Ref Range Status   Specimen Description BLOOD LEFT FOREARM  Final   Special Requests   Final    BOTTLES DRAWN AEROBIC AND ANAEROBIC Blood Culture adequate volume   Culture   Final    NO GROWTH 2 DAYS Performed at [facility], [address], [place], [place] [address]    Report Status PENDING  Incomplete  Respiratory Panel by PCR     Status: None   Collection Time: 02/10/19  8:22 PM  Result Value Ref Range Status   Adenovirus NOT DETECTED NOT DETECTED Final   Coronavirus 229E NOT DETECTED NOT DETECTED Final    Comment: (NOTE) The Coronavirus on the Respiratory Panel, DOES NOT test for the novel  Coronavirus (2019 nCoV)    Coronavirus HKU1 NOT DETECTED NOT DETECTED Final   Coronavirus NL63 NOT DETECTED NOT DETECTED Final   Coronavirus OC43 NOT DETECTED NOT DETECTED Final   Metapneumovirus NOT DETECTED NOT DETECTED Final   Rhinovirus / Enterovirus NOT DETECTED NOT DETECTED Final   Influenza A NOT DETECTED NOT DETECTED Final   Influenza B NOT DETECTED NOT DETECTED Final   Parainfluenza Virus 1 NOT DETECTED NOT DETECTED Final   Parainfluenza Virus 2 NOT DETECTED NOT DETECTED Final   Parainfluenza Virus 3 NOT DETECTED NOT DETECTED Final   Parainfluenza Virus 4 NOT DETECTED NOT DETECTED Final   Respiratory Syncytial Virus NOT DETECTED NOT DETECTED Final  Bordetella pertussis NOT DETECTED NOT DETECTED Final   Chlamydophila pneumoniae NOT DETECTED NOT DETECTED Final   Mycoplasma pneumoniae NOT DETECTED NOT DETECTED Final    Comment: Performed at [facility], [address] [place], [place] [address]      Time coordinating discharge: 25 minutes  SIGNED:   Lanae Boast, MD  [facility] 02/12/2019, 2:47 PM  If 7PM-7AM, please contact night-coverage [email or web address]

## 2019-02-12 NOTE — Progress Notes (Signed)
Pt discharge instructions reviewed with pt. Pt verbalizes understanding and states he has no questions. Prescriptions given to pt. Pt belongings with pt. Pt is not in distress. Pt refuses wheelchair for discharge. Pt's friend is driving him home.

## 2019-02-13 MED FILL — CEFDINIR 300 MG CAPSULE: 300 | 3 days supply | Qty: 6 | Fill #0

## 2019-02-13 MED FILL — DOXYCYCLINE HYC 100 MG CAPS: 100 | 3 days supply | Qty: 6 | Fill #0

## 2019-02-13 MED FILL — predniSONE 10 MG TABS: 10 | 5 days supply | Qty: 5 | Fill #0

## 2019-02-15 LAB — CULTURE, BLOOD (ROUTINE X 2)
Culture: NO GROWTH
Culture: NO GROWTH
Special Requests: ADEQUATE
Special Requests: ADEQUATE

## 2022-03-12 ENCOUNTER — Emergency Department (HOSPITAL_COMMUNITY): Payer: Self-pay

## 2022-03-12 ENCOUNTER — Encounter (HOSPITAL_COMMUNITY): Payer: Self-pay

## 2022-03-12 ENCOUNTER — Other Ambulatory Visit: Payer: Self-pay

## 2022-03-12 ENCOUNTER — Inpatient Hospital Stay (HOSPITAL_COMMUNITY)
Admission: EM | Admit: 2022-03-12 | Discharge: 2022-03-13 | DRG: 074 | Discharge status: Against Medical Advice | Payer: Self-pay | Attending: Internal Medicine | Admitting: Internal Medicine

## 2022-03-12 DIAGNOSIS — M4802 Spinal stenosis, cervical region: Secondary | ICD-10-CM | POA: Diagnosis present

## 2022-03-12 DIAGNOSIS — Z5329 Procedure and treatment not carried out because of patient's decision for other reasons: Secondary | ICD-10-CM | POA: Diagnosis present

## 2022-03-12 DIAGNOSIS — M47812 Spondylosis without myelopathy or radiculopathy, cervical region: Secondary | ICD-10-CM | POA: Diagnosis present

## 2022-03-12 DIAGNOSIS — H9319 Tinnitus, unspecified ear: Secondary | ICD-10-CM | POA: Diagnosis present

## 2022-03-12 DIAGNOSIS — Z801 Family history of malignant neoplasm of trachea, bronchus and lung: Secondary | ICD-10-CM

## 2022-03-12 DIAGNOSIS — H538 Other visual disturbances: Secondary | ICD-10-CM | POA: Diagnosis present

## 2022-03-12 DIAGNOSIS — Z981 Arthrodesis status: Secondary | ICD-10-CM

## 2022-03-12 DIAGNOSIS — F17210 Nicotine dependence, cigarettes, uncomplicated: Secondary | ICD-10-CM | POA: Diagnosis present

## 2022-03-12 DIAGNOSIS — Z7951 Long term (current) use of inhaled steroids: Secondary | ICD-10-CM

## 2022-03-12 DIAGNOSIS — D72829 Elevated white blood cell count, unspecified: Secondary | ICD-10-CM | POA: Diagnosis present

## 2022-03-12 DIAGNOSIS — R42 Dizziness and giddiness: Secondary | ICD-10-CM | POA: Diagnosis present

## 2022-03-12 DIAGNOSIS — R202 Paresthesia of skin: Secondary | ICD-10-CM | POA: Diagnosis present

## 2022-03-12 DIAGNOSIS — F419 Anxiety disorder, unspecified: Secondary | ICD-10-CM | POA: Diagnosis present

## 2022-03-12 DIAGNOSIS — D751 Secondary polycythemia: Secondary | ICD-10-CM | POA: Diagnosis present

## 2022-03-12 DIAGNOSIS — Z8249 Family history of ischemic heart disease and other diseases of the circulatory system: Secondary | ICD-10-CM

## 2022-03-12 DIAGNOSIS — M4803 Spinal stenosis, cervicothoracic region: Secondary | ICD-10-CM | POA: Diagnosis present

## 2022-03-12 DIAGNOSIS — I639 Cerebral infarction, unspecified: Secondary | ICD-10-CM | POA: Diagnosis present

## 2022-03-12 DIAGNOSIS — R531 Weakness: Principal | ICD-10-CM | POA: Diagnosis present

## 2022-03-12 DIAGNOSIS — R262 Difficulty in walking, not elsewhere classified: Secondary | ICD-10-CM | POA: Diagnosis present

## 2022-03-12 DIAGNOSIS — Z7982 Long term (current) use of aspirin: Secondary | ICD-10-CM

## 2022-03-12 DIAGNOSIS — Q761 Klippel-Feil syndrome: Secondary | ICD-10-CM

## 2022-03-12 DIAGNOSIS — R079 Chest pain, unspecified: Secondary | ICD-10-CM | POA: Diagnosis present

## 2022-03-12 DIAGNOSIS — G629 Polyneuropathy, unspecified: Principal | ICD-10-CM | POA: Diagnosis present

## 2022-03-12 DIAGNOSIS — R17 Unspecified jaundice: Secondary | ICD-10-CM | POA: Diagnosis present

## 2022-03-12 DIAGNOSIS — Z880 Allergy status to penicillin: Secondary | ICD-10-CM

## 2022-03-12 DIAGNOSIS — R519 Headache, unspecified: Secondary | ICD-10-CM | POA: Diagnosis present

## 2022-03-12 DIAGNOSIS — J439 Emphysema, unspecified: Secondary | ICD-10-CM | POA: Diagnosis present

## 2022-03-12 LAB — CBC
HCT: 54.4 % — ABNORMAL HIGH (ref 39.0–52.0)
Hemoglobin: 17.9 g/dL — ABNORMAL HIGH (ref 13.0–17.0)
MCH: 32.8 pg (ref 26.0–34.0)
MCHC: 32.9 g/dL (ref 30.0–36.0)
MCV: 99.8 fL (ref 80.0–100.0)
Platelets: 289 10*3/uL (ref 150–400)
RBC: 5.45 MIL/uL (ref 4.22–5.81)
RDW: 14.1 % (ref 11.5–15.5)
WBC: 12.6 10*3/uL — ABNORMAL HIGH (ref 4.0–10.5)
nRBC: 0 % (ref 0.0–0.2)

## 2022-03-12 LAB — TROPONIN I (HIGH SENSITIVITY)
Troponin I (High Sensitivity): 6 ng/L (ref <18)
Troponin I (High Sensitivity): 6 ng/L (ref <18)

## 2022-03-12 LAB — BASIC METABOLIC PANEL
Anion gap: 10 (ref 5–15)
BUN: 9 mg/dL (ref 8–23)
CO2: 20 mmol/L — ABNORMAL LOW (ref 22–32)
Calcium: 9 mg/dL (ref 8.9–10.3)
Chloride: 108 mmol/L (ref 98–111)
Creatinine, Ser: 0.99 mg/dL (ref 0.61–1.24)
GFR, Estimated: >60 mL/min (ref >60)
Glucose, Bld: 118 mg/dL — ABNORMAL HIGH (ref 70–99)
Potassium: 4.3 mmol/L (ref 3.5–5.1)
Sodium: 138 mmol/L (ref 135–145)

## 2022-03-12 NOTE — ED Triage Notes (Signed)
Pt reports a central dull chest pain that started last night. He reports numbness to LEFT arm and leg that started 2 days ago associated with a HA that started one month ago. ?

## 2022-03-13 ENCOUNTER — Inpatient Hospital Stay (HOSPITAL_COMMUNITY): Payer: Self-pay

## 2022-03-13 ENCOUNTER — Emergency Department (HOSPITAL_COMMUNITY): Payer: Self-pay

## 2022-03-13 DIAGNOSIS — I639 Cerebral infarction, unspecified: Secondary | ICD-10-CM

## 2022-03-13 LAB — PROTIME-INR
INR: 1 (ref 0.8–1.2)
Prothrombin Time: 13 seconds (ref 11.4–15.2)

## 2022-03-13 LAB — HEPATIC FUNCTION PANEL
ALT: 17 U/L (ref 0–44)
AST: 21 U/L (ref 15–41)
Albumin: 3.8 g/dL (ref 3.5–5.0)
Alkaline Phosphatase: 78 U/L (ref 38–126)
Bilirubin, Direct: 0.2 mg/dL (ref 0.0–0.2)
Indirect Bilirubin: 1.6 mg/dL — ABNORMAL HIGH (ref 0.3–0.9)
Total Bilirubin: 1.8 mg/dL — ABNORMAL HIGH (ref 0.3–1.2)
Total Protein: 6.7 g/dL (ref 6.5–8.1)

## 2022-03-13 LAB — LIPID PANEL
Cholesterol: 94 mg/dL (ref 0–200)
HDL: 53 mg/dL (ref >40)
LDL Cholesterol: 28 mg/dL (ref 0–99)
Total CHOL/HDL Ratio: 1.8 RATIO
Triglycerides: 67 mg/dL (ref <150)
VLDL: 13 mg/dL (ref 0–40)

## 2022-03-13 LAB — HIV ANTIBODY (ROUTINE TESTING W REFLEX): HIV Screen 4th Generation wRfx: NONREACTIVE

## 2022-03-13 LAB — VITAMIN B12: Vitamin B-12: 240 pg/mL (ref 180–914)

## 2022-03-13 MED ORDER — IOHEXOL 350 MG/ML SOLN
100.0000 mL | Freq: Once | INTRAVENOUS | Status: AC | PRN
  Administered 2022-03-13: 100 mL via INTRAVENOUS

## 2022-03-13 MED ORDER — ASPIRIN EC 325 MG PO TBEC: 325.0000 mg | DELAYED_RELEASE_TABLET | Freq: Every day | ORAL | Status: DC | PRN

## 2022-03-13 MED ORDER — KETOROLAC TROMETHAMINE 30 MG/ML IJ SOLN
15.0000 mg | Freq: Once | INTRAMUSCULAR | Status: AC
Start: 2022-03-13 — End: 2022-03-13
  Administered 2022-03-13: 15 mg via INTRAVENOUS
  Filled 2022-03-13: qty 1

## 2022-03-13 MED ORDER — LORATADINE 10 MG PO TABS
10.0000 mg | ORAL_TABLET | Freq: Every day | ORAL | Status: DC
Start: 2022-03-13 — End: 2022-03-14
  Administered 2022-03-13: 10 mg via ORAL
  Filled 2022-03-13: qty 1

## 2022-03-13 MED ORDER — NICOTINE 21 MG/24HR TD PT24
21.0000 mg | MEDICATED_PATCH | Freq: Every day | TRANSDERMAL | Status: DC
  Administered 2022-03-13: 21 mg via TRANSDERMAL
  Filled 2022-03-13: qty 1

## 2022-03-13 MED ORDER — CYANOCOBALAMIN 1000 MCG/ML IJ SOLN
1000.0000 ug | Freq: Once | INTRAMUSCULAR | Status: DC
  Filled 2022-03-13: qty 1

## 2022-03-13 MED ORDER — ACETAMINOPHEN 160 MG/5ML PO SOLN: 650.0000 mg | ORAL | Status: DC | PRN

## 2022-03-13 MED ORDER — MAGNESIUM SULFATE IN D5W 1-5 GM/100ML-% IV SOLN
1.0000 g | Freq: Two times a day (BID) | INTRAVENOUS | Status: DC
Start: 2022-03-13 — End: 2022-03-14
  Filled 2022-03-13: qty 100

## 2022-03-13 MED ORDER — STROKE: EARLY STAGES OF RECOVERY BOOK: Freq: Once | Status: AC

## 2022-03-13 MED ORDER — DIPHENHYDRAMINE HCL 50 MG/ML IJ SOLN
25.0000 mg | Freq: Four times a day (QID) | INTRAMUSCULAR | Status: DC
  Administered 2022-03-13: 25 mg via INTRAVENOUS
  Filled 2022-03-13: qty 1

## 2022-03-13 MED ORDER — SENNOSIDES-DOCUSATE SODIUM 8.6-50 MG PO TABS: 1.0000 | ORAL_TABLET | Freq: Every evening | ORAL | Status: DC | PRN

## 2022-03-13 MED ORDER — ACETAMINOPHEN 650 MG RE SUPP: 650.0000 mg | RECTAL | Status: DC | PRN

## 2022-03-13 MED ORDER — ENOXAPARIN SODIUM 40 MG/0.4ML IJ SOSY
40.0000 mg | PREFILLED_SYRINGE | INTRAMUSCULAR | Status: DC
  Administered 2022-03-13: 40 mg via SUBCUTANEOUS
  Filled 2022-03-13: qty 0.4

## 2022-03-13 MED ORDER — PROCHLORPERAZINE EDISYLATE 10 MG/2ML IJ SOLN
10.0000 mg | Freq: Four times a day (QID) | INTRAMUSCULAR | Status: DC
  Administered 2022-03-13: 10 mg via INTRAVENOUS
  Filled 2022-03-13: qty 2

## 2022-03-13 MED ORDER — ACETAMINOPHEN 325 MG PO TABS: 650.0000 mg | ORAL_TABLET | ORAL | Status: DC | PRN

## 2022-03-13 MED ORDER — SODIUM CHLORIDE 0.9 % IV BOLUS
500.0000 mL | Freq: Two times a day (BID) | INTRAVENOUS | Status: DC
Start: 2022-03-13 — End: 2022-03-14

## 2022-03-13 MED ORDER — KETOROLAC TROMETHAMINE 15 MG/ML IJ SOLN
15.0000 mg | Freq: Four times a day (QID) | INTRAMUSCULAR | Status: DC
  Administered 2022-03-13: 15 mg via INTRAVENOUS
  Filled 2022-03-13: qty 1

## 2022-03-13 NOTE — ED Notes (Signed)
Pt left AMA. Eulas Post MD spoke with pt before leaving. This RN reinforced importance of following up with neurology outpatient. Pt verbalized understanding. VSS upon discharge. ?

## 2022-03-13 NOTE — Consult Note (Signed)
NEUROLOGY CONSULTATION NOTE  ? ?Date of service: March 13, 2022 ?Patient Name: Alejandro Ferguson ?MRN:  [id number] ?DOB:  May 02, 1961 ?Reason for consult: "left side weakness" ?Requesting Provider: Charise Killian, MD ? ?History of Present Illness  ?Alejandro Ferguson is a 61 y.o. male with PMH of polysubstance abuse (cocaine, cannabis, tobacco, EtOH), does not follow PCP. Presented to [facility] for leftsided numbness and weakness x2-3, convinced by his friend for concern of stroke. He also has complaints of headaches x1 month.  ? ?Prior about 1 month ago, he started having headaches and dizziness along with ear ringing. No preceding symptoms, SOB, palpitations, N/V, photophobia. Headache pain is unchanged by positions and valsalva, and he denies vision changes or pulsatile tinnitus ? ?Numbness and weakness in left arm and leg x 2-3 days that occurred while walking at home, also occurs at rest. About 3 weeks ago, he fell forward on his face in grass when stepped out of his car. During this time he had dizziness then felt is LLE give out. He was able to get up quickly without difficulties. No witnesses. He denied associating n/v, sweating, palpitations, sob, cp.  ? ?He also has decreased sensation and tingling in glove stoking distribution in all 4 limbs with more worsening sxs on LLE>LUE. Reported more numbness in the back of his leg. Denied claudication.  ?He denied huffing or recent NO use.  ? ?He used cocaine at friends party about 3 days ago. He smoke tobacco and cannabis near daily. Drinks EtOH near daily. He is interested in smoking cessation.  ? ?Neck surgery in 1998 for discs. ? ?ROS  ? ?Constitutional Denies weight loss, fever and chills.   ?HEENT Tinnitus   ?Respiratory SOB   ?CV Denies palpitations and CP   ?GI Denies abdominal pain, nausea, vomiting and diarrhea.   ?GU Denies dysuria and urinary frequency.   ?Neurological Headache. Denies syncope.   ? ?Past History  ? ?Past Medical History:  ?Diagnosis Date  ? Anxiety   ? COPD  (chronic obstructive pulmonary disease) ([place])   ? Emphysema   ? ETOH abuse   ? Pneumonia   ? "several times" (07/09/2017)  ? Polysubstance abuse ([place])   ? THC, Cocaine  ? Tuberculosis   ? "when I was little" (07/09/2017)  ? ?Past Surgical History:  ?Procedure Laterality Date  ? ANTERIOR CERVICAL DECOMP/DISCECTOMY FUSION    ? APPENDECTOMY    ? BACK SURGERY    ? INGUINAL HERNIA REPAIR Left   ? ORBITAL FRACTURE SURGERY Right   ? "I've got a plate over my right eye"  ? SHOULDER ARTHROSCOPY WITH ROTATOR CUFF REPAIR Left   ? ULNAR NERVE REPAIR Right   ? "@ elbow"  ? ?Family History  ?Problem Relation Age of Onset  ? Hypertension Mother   ? Lung cancer Other   ? ?Social History  ? ?Socioeconomic History  ? Marital status: Married  ?  Spouse name: Not on file  ? Number of children: Not on file  ? Years of education: Not on file  ? Highest education level: Not on file  ?Occupational History  ? Not on file  ?Tobacco Use  ? Smoking status: Former  ?  Packs/day: 1.00  ?  Years: 38.00  ?  Pack years: 38.00  ?  Types: Cigarettes  ? Smokeless tobacco: Never  ?Vaping Use  ? Vaping Use: Never used  ?Substance and Sexual Activity  ? Alcohol use: Yes  ?  Alcohol/week: 18.0 standard drinks  ?  Types: 18 Cans of beer per week  ?  Comment: 07/09/2017 "maybe 18/ beersweek on average"  ? Drug use: Yes  ?  Types: Marijuana, Cocaine  ?  Comment: 07/09/2017 "weed weekly; powder q couple months"  ? Sexual activity: Yes  ?Other Topics Concern  ? Not on file  ?Social History Narrative  ? Lives in [place]. Currently staying with his girlfriend. 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   ? ?Social Determinants of Health  ? ?Financial Resource Strain: Not on file  ?Food Insecurity: Not on file  ?Transportation Needs: Not on file  ?Physical Activity: Not on file  ?Stress: Not on file  ?Social Connections: Not on file  ? ?Allergies  ?Allergen Reactions  ? Penicillins Other (See Comments)  ?  Has patient had a PCN reaction causing immediate rash,  facial/tongue/throat swelling, SOB or lightheadedness with hypotension: Unknown ?Has patient had a PCN reaction causing severe rash involving mucus membranes or skin necrosis: No ?Has patient had a PCN reaction that required hospitalization: No ?Has patient had a PCN reaction occurring within the last 10 years: No (childhood reaction) ?If all of the above answers are "NO", then may proceed with Cephalosporin use. ?Unknown childhood allergic reaction  ? ? ?Medications  ?(Not in a hospital admission) ?  ? ?Vitals  ? ?Vitals:  ? 03/13/22 0447 03/13/22 0806 03/13/22 1115 03/13/22 1200  ?BP: 130/90 (!) 127/99 (!) 150/108 (!) 147/96  ?Pulse: 87 72 76 86  ?Resp: 16 14 18 19   ?Temp:  98.1 ?F (36.7 ?C) 98.3 ?F (36.8 ?C)   ?TempSrc:  Oral    ?SpO2: 97% 98% 98% 96%  ?Weight:      ?Height:      ?  ? ?Body mass index is 24.41 kg/m?. ? ? ? ?Physical Exam  ?General: Laying comfortably in bed; in no acute distress.  ?Pulmonary: Symmetric chest rise. Non-labored respiratory effort.  ?HEENT: No occipital tenderness ?Abdomen: Soft nontender ?Ext: No cyanosis, edema, or deformity  ? ?Neurologic Examination  ?Mental status/Cognition:  ?Alert. Oriented to self, place, month and year. ?Good attention-months backwards.  ?Speech/language:  ?Fluent, thought content appropriate.  ?Comprehension intact-able to follow 3 step commands without difficulty.  ?Object naming intact, repetition intact.    ? ?Cranial Nerves: ?II: no VF deficits.  Pupils equal round reactive to light ?III,IV, VI: ptosis not present, extra-ocular motions intact bilaterally. pupils equal, round, reactive to light and accommodation, no gaze preference or deviation. no nystagmus, no catch-up saccade  ?V: facial light touch sensation normal bilaterally ?VII: smile symmetric, no asymmetry, no nasolabial fold flattening  ?VIII: hearing normal bilaterally ?IX,X: cough and gag intact. uvula rises symmetrically.  ?XI: bilateral shoulder shrug and head turn ?XII: midline tongue  extension ?Motor: ?normal tone throughout. normal bulk throughout. no atrophy noted ?4+/5 weakness on left UE at finger flexion, flex and extension at elbow. ?4+/5 hip flexion  ?5/5 otherwise ? ?Sensory:  ?Length dependent loss of temperature sensation in the bilateral upper extremities and lower extremities, with worsened sensation in the left arm and leg compared to the right.  C7 sensory level on the left back to pinprick, intact on the right.  ? ?Deep Tendon Reflexes:  ?Triceps 2+ ?Brachio 3+ ?No hoffman ?Patellar 2+ ?No clonus at the ankles ?Plantars: mute bilaterally  ? ?Coordination/Complex Motor:  ?- Finger to Nose intact ? ?Gait: Able to stand on heels and toes, unable to tandem gait ? ? ? ?Labs  ? ?CBC:  ?Recent Labs  ?Lab 03/12/22 ?1615  ?  WBC 12.6*  ?HGB 17.9*  ?HCT 54.4*  ?MCV 99.8  ?PLT 289  ? ? ?Basic Metabolic Panel:  ?Lab Results  ?Component Value Date  ? NA 138 03/12/2022  ? K 4.3 03/12/2022  ? CO2 20 (L) 03/12/2022  ? GLUCOSE 118 (H) 03/12/2022  ? BUN 9 03/12/2022  ? CREATININE 0.99 03/12/2022  ? CALCIUM 9.0 03/12/2022  ? GFRNONAA >60 03/12/2022  ? GFRAA >60 02/11/2019  ? ?Lipid Panel: No results found for: [place] ?HgbA1c: No results found for: HGBA1C ?Urine Drug Screen:  ?   ?Component Value Date/Time  ? LABOPIA POSITIVE (A) 02/10/2019 2241  ? COCAINSCRNUR NONE DETECTED 02/10/2019 2241  ? LABBENZ NONE DETECTED 02/10/2019 2241  ? AMPHETMU NONE DETECTED 02/10/2019 2241  ? THCU NONE DETECTED 02/10/2019 2241  ? LABBARB NONE DETECTED 02/10/2019 2241  ?  ?Alcohol Level  ?   ?Component Value Date/Time  ? ETH <5 07/15/2017 2154  ? ?CT Head without contrast(Personally reviewed): ?No acute intracranial abnormality. 2. Right maxillary sinusitis ? ?MRI Brain and Cervical spine (Personally reviewed): ?MR BRAIN WO CONTRAST ? ?Result Date: 03/13/2022 ?CLINICAL DATA:  Neuro deficit, acute, stroke suspected EXAM: MRI HEAD WITHOUT CONTRAST TECHNIQUE: Multiplanar, multiecho pulse sequences of the brain and  surrounding structures were obtained without intravenous contrast. COMPARISON:  None. FINDINGS: Brain: There is no acute infarction or intracranial hemorrhage. There is no intracranial mass, mass effect, or edema. There is no

## 2022-03-13 NOTE — H&P (Signed)
? ?NAME:  Alejandro Ferguson, MRN:  [id number], DOB:  [birth year], LOS: 0 ?ADMISSION DATE:  03/12/2022, Primary: Patient, No Pcp Per (Inactive)  ?CHIEF COMPLAINT:  left arm and leg weakness  ? ?Medical Service: Internal Medicine Teaching Service    ?     ?Attending Physician: Dr. Charise Killian, MD    ?First Contact: Dr. Elliot Gurney Pager: [phone]  ?Second Contact: Dr. Johnney Ou Pager: [phone]  ?     ?After Hours (After 5p/  First Contact Pager: [phone]  ?weekends / holidays): Second Contact Pager: [phone]  ? ? ?History of present illness   ?XAYNE Ferguson is a 61 year old male who presented to the ED yesterday for 3d history of left arm and leg numbness/tingling. Sx first noted upon awakening. Denies associated weakness. Not related to position of his neck. Symptoms are unchanged from onset. Denies recent head or neck trauma although does not a prior injury to his neck in 1998 when he fell off a scaffold.  ? ?He reports approximately 4w history of a constant occipital headache. Denies sensitivity to light or sound. No visual disturbance. Denies neck stiffness. Reports that, prior to the last month, he would have a headache every 2-3 months.  ? ?He reports a 2-3 month history of episodic vertigo, last 2-3 minutes typically. Not positionally related. They can occur while he is driving, walking, sitting, laying in bed. Denies hearing loss or tinnitus.  ? ?He does not take any medication, denies recent illness or similar symptoms in the past. Admits to tobacco use. Denies alcohol or illicit substance use.  ? ?Past Medical History  ?He,  has a past medical history of Anxiety, COPD (chronic obstructive pulmonary disease) ([place]), Emphysema, ETOH abuse, Pneumonia, Polysubstance abuse ([place]), and Tuberculosis.  ? ?Home Medications    ? ?Prior to Admission medications   ?Medication Sig Start Date End Date Taking? Authorizing Provider  ?aspirin EC 325 MG tablet Take 325 mg by mouth daily as needed (pain).    [provider]   ?albuterol-ipratropium (COMBIVENT) 18-103 MCG/ACT inhaler Inhale 2 puffs into the lungs every 6 (six) hours as needed. Shortness of breath   02/16/12  [provider]  ? ? ?Allergies   ? ?Allergies as of 03/12/2022 - Review Complete 03/12/2022  ?Allergen Reaction Noted  ? Penicillins Other (See Comments) 07/21/2011  ? ? ?Social History  ? reports that he has quit smoking. His smoking use included cigarettes. He has a 38.00 pack-year smoking history. He has never used smokeless tobacco. He reports current alcohol use of about 18.0 standard drinks per week. He reports current drug use. Drugs: Marijuana and Cocaine.  ? ?Family History   ?His family history includes Hypertension in his mother; Lung cancer in an other family member.  ? ? ?Objective   ?Blood pressure (!) 141/95, pulse 71, temperature 98.3 ?F (36.8 ?C), resp. rate 18, height 6' (1.829 m), weight 81.6 kg, SpO2 96 %. ?   ?General: chronically ill appearing male sitting comfortably in bed ?Cardiac: RRR ?Pulm: lungs clear ?GI: soft, non-tender ?Skin: warm and dry ?Neurologic exam ?Mental status: a/o x4 ?Speech: normal; no dysarthria ?Cranial nerves: PERRL, EOMs intact, no facial droop, tongue and uvula midline, shoulder shrug equal ?Motor: 5/5 strength in the bilateral upper and lower extremities, no tremor ?Sensory: sensation to touch intact in the bilateral upper and lower extremities ?Cerebellar: normal finger to nose, heel to shin, and rapid alternation motions ?Gait: trendelenburg gait, mildly unsteady ? ?Significant Diagnostic Tests:  ? ? ? ?  Latest Ref Rng & Units 03/12/2022  ?  4:15 PM 02/11/2019  ?  4:58 AM 02/10/2019  ? 10:52 PM  ?CBC  ?WBC 4.0 - 10.5 K/uL 12.6   10.4   11.4    ?Hemoglobin 13.0 - 17.0 g/dL 17.9   16.0   16.4    ?Hematocrit 39.0 - 52.0 % 54.4   47.8   47.7    ?Platelets 150 - 400 K/uL 289   201   203    ? ? ?  Latest Ref Rng & Units 03/12/2022  ?  4:15 PM 02/11/2019  ?  4:58 AM 02/10/2019  ? 10:52 PM  ?BMP  ?Glucose 70 - 99 mg/dL  118   103     ?BUN 8 - 23 mg/dL 9   9     ?Creatinine 0.61 - 1.24 mg/dL 0.99   0.83   0.95    ?Sodium 135 - 145 mmol/L 138   137     ?Potassium 3.5 - 5.1 mmol/L 4.3   3.5     ?Chloride 98 - 111 mmol/L 108   101     ?CO2 22 - 32 mmol/L 20   25     ?Calcium 8.9 - 10.3 mg/dL 9.0   8.6     ? ? ?  Latest Ref Rng & Units 03/13/2022  ? 11:23 AM 02/10/2019  ? 10:52 PM 07/15/2017  ?  9:53 PM  ?Hepatic Function  ?Total Protein 6.5 - 8.1 g/dL 6.7   6.4   7.1    ?Albumin 3.5 - 5.0 g/dL 3.8   3.3   4.1    ?AST 15 - 41 U/L 21   38   24    ?ALT 0 - 44 U/L '17   28   22    ' ?Alk Phosphatase 38 - 126 U/L 78   68   76    ?Total Bilirubin 0.3 - 1.2 mg/dL 1.8   0.9   0.8    ?Bilirubin, Direct 0.0 - 0.2 mg/dL 0.2   0.2     ? ?DG Chest 2 View ?Result Date: 03/12/2022 ? IMPRESSION: 1. No acute abnormality. 2. Emphysema with apical blebs, chronic hyperinflation and bronchial thickening.  ? ?CT Head Wo Contrast ?Result Date: 03/13/2022 ?IMPRESSION: 1. No acute intracranial abnormality. 2. Right maxillary sinusitis. ? ?MR BRAIN WO CONTRAST ?Result Date: 03/13/2022 ? IMPRESSION: No evidence of recent infarction, hemorrhage, mass. Minor chronic microvascular ischemic changes. Chronic right maxillary sinusitis. Superimposed acute sinusitis is possible in appropriate clinical setting.  ? ?MR CERVICAL SPINE WO CONTRAST ?Result Date: 03/13/2022 ?IMPRESSION: 1. Congenital fusion of the C4 through C6 vertebral bodies (Klippel-Feil). 2. Uncovertebral and facet arthropathy at C3-C4 and C6-C7 resulting in moderate spinal canal stenosis and severe bilateral neural foraminal stenosis at C3-C4 and mild spinal canal stenosis and severe bilateral neural foraminal stenosis at C6-C7. 3. Moderate right neural foraminal stenosis at C2-C3, and mild-to-moderate bilateral neural foraminal stenosis at C7-T1.  ? ?Summary  ?61 year old male admitted for further workup of 3d history of numbness/tingling of the left upper and lower extremity. ? ?Assessment & Plan:  ? ?Left  upper/lower extremity paraesthesias (3d) ?Occipital headache (4w) ?Non-positional vertigo (3-4 months) ?Initial concern was stroke, however this can not be ruled out based on negative CT and MRI findings. Same with space occupying lesion. C-spine MRI does reveal significant C3-C4 stenosis however this would not explain the lower extremity symptoms. No neck pain or photosensitivity to suggest meningitis. Based  on this, question if this is a complex migraine.  ?Appreciate neurology's thoughts on this case ?Will d/c stroke orders. He can have a regular diet. No need for SLP at this time. ?Based on my observation of his gait, I would still like to have PT evaluate him. Fall precautions.  ?UDS ? ?Mild indirect hyperbilirubinemia ?Mild leukocytosis ?Mild erythrocytosis ?Lab findings may be attributable to mild hemoconcentration. Will repeat labs in the morning. ? ?Tobacco use disorder ?Nicotine patch, counseled on cessation ?Best practice:  ?CODE STATUS: Full ?DVT for prophylaxis: lovenox ?Dispo: Admit patient to Inpatient with expected length of stay greater than 2 midnights. ? ? ?Mitzi Hansen, MD ?Internal Medicine Resident PGY-3 ?[facility] ?Pager: [phone] ?03/13/2022 3:30 PM  ?  ? ? ? ?

## 2022-03-13 NOTE — ED Provider Notes (Signed)
?[facility] EMERGENCY DEPARTMENT ?Provider Note ? ? ?CSN: [id number] ?Arrival date & time: 03/12/22  1555 ? ?  ? ?History ? ?Chief Complaint  ?Patient presents with  ? Numbness  ? Chest Pain  ? ? ?Alejandro Ferguson is a 61 y.o. male who presents emergency department with a chief complaint of weakness and numbness of the left side.  Patient states that he has had a persistent occipital headache for 1 month.  3 days ago he woke up with numbness in his left arm and leg.  Since that time he has noticed he is also weak and having difficulty walking.  He has had some intermittent blurry vision.  He saw his aunt who took his blood pressure for him and noted that his systolic pressure was very high.  He does not see doctors.  He smokes daily.  He drinks alcohol regularly states that sometimes "I have 2 beers, sometimes I have 12 beers."  Patient denies any active chest pain or shortness of breath.  He does not regularly see doctors. ? ? ?Chest Pain ? ?  ? ?Home Medications ?Prior to Admission medications   ?Medication Sig Start Date End Date Taking? Authorizing Provider  ?aspirin EC 325 MG tablet Take 325 mg by mouth daily as needed (pain).    [provider]  ?guaiFENesin-dextromethorphan (ROBITUSSIN DM) 100-10 MG/5ML syrup Take 5 mLs by mouth every 4 (four) hours as needed for cough. 02/12/19   Lanae Boast, MD  ?morphine (MSIR) 15 MG tablet Take 1 tablet (15 mg total) by mouth every 4 (four) hours as needed for severe pain. ?Patient not taking: Reported on 09/28/2017 07/16/17   Melene Plan, DO  ?oxyCODONE-acetaminophen (PERCOCET) 7.5-325 MG tablet Take 1 tablet by mouth every 6 (six) hours as needed for severe pain. ?Patient not taking: Reported on 09/28/2017 07/11/17   Valentino Nose, MD  ?albuterol-ipratropium (COMBIVENT) 18-103 MCG/ACT inhaler Inhale 2 puffs into the lungs every 6 (six) hours as needed. Shortness of breath   02/16/12  [provider]  ?   ? ?Allergies    ?Penicillins    ? ?Review of Systems   ?Review of Systems  ?Cardiovascular:  Positive for chest pain.  ? ?Physical Exam ?Updated Vital Signs ?BP (!) 127/99   Pulse 72   Temp 98.1 ?F (36.7 ?C) (Oral)   Resp 14   Ht 6' (1.829 m)   Wt 81.6 kg   SpO2 98%   BMI 24.41 kg/m?  ?Physical Exam ?Vitals and nursing note reviewed.  ?Constitutional:   ?   General: He is not in acute distress. ?   Appearance: He is well-developed. He is not diaphoretic.  ?HENT:  ?   Head: Normocephalic and atraumatic.  ?Eyes:  ?   General: No scleral icterus. ?   Conjunctiva/sclera: Conjunctivae normal.  ?Cardiovascular:  ?   Rate and Rhythm: Normal rate and regular rhythm.  ?   Heart sounds: Normal heart sounds.  ?Pulmonary:  ?   Effort: Pulmonary effort is normal. No respiratory distress.  ?   Breath sounds: Normal breath sounds.  ?Abdominal:  ?   Palpations: Abdomen is soft.  ?   Tenderness: There is no abdominal tenderness.  ?Musculoskeletal:  ?   Cervical back: Normal range of motion and neck supple.  ?   Right lower leg: No edema.  ?Skin: ?   General: Skin is warm and dry.  ?Neurological:  ?   Mental Status: He is alert and oriented to person,  place, and time.  ?   Cranial Nerves: No cranial nerve deficit.  ?   Sensory: Sensory deficit present.  ?   Motor: Weakness present.  ?   Coordination: Coordination normal.  ?   Gait: Gait abnormal.  ?   Comments: Abnormal sensation to light touch in the left lower extremity as compared to the right upper  ?Weakness noted in the left upper and lower extremity 4 out of 5 strength ?Abnormal gait and balance.  ?Psychiatric:     ?   Behavior: Behavior normal.  ? ? ?ED Results / Procedures / Treatments   ?Labs ?(all labs ordered are listed, but only abnormal results are displayed) ?Labs Reviewed  ?BASIC METABOLIC PANEL - Abnormal; Notable for the following components:  ?    Result Value  ? CO2 20 (*)   ? Glucose, Bld 118 (*)   ? All other components within normal limits  ?CBC - Abnormal; Notable for the following  components:  ? WBC 12.6 (*)   ? Hemoglobin 17.9 (*)   ? HCT 54.4 (*)   ? All other components within normal limits  ?TROPONIN I (HIGH SENSITIVITY)  ?TROPONIN I (HIGH SENSITIVITY)  ? ? ?EKG ?None ? ?Radiology ?DG Chest 2 View ? ?Result Date: 03/12/2022 ?CLINICAL DATA:  Chest pain.  Left arm numbness. EXAM: CHEST - 2 VIEW COMPARISON:  Radiograph 02/10/2019, CT 05/13/2016 FINDINGS: Emphysema with apical blebs and chronic bronchial thickening. Chronic hyperinflation. The heart is normal in size. Stable mediastinal contours. No acute airspace disease. No pleural effusion or pneumothorax. No acute osseous findings. IMPRESSION: 1. No acute abnormality. 2. Emphysema with apical blebs, chronic hyperinflation and bronchial thickening. Electronically Signed   By: Narda RutherfordMelanie  Sanford M.D.   On: 03/12/2022 17:29  ? ?CT Head Wo Contrast ? ?Result Date: 03/13/2022 ?CLINICAL DATA:  Numbness of the left arm and leg that started 2 days ago. EXAM: CT HEAD WITHOUT CONTRAST TECHNIQUE: Contiguous axial images were obtained from the base of the skull through the vertex without intravenous contrast. RADIATION DOSE REDUCTION: This exam was performed according to the departmental dose-optimization program which includes automated exposure control, adjustment of the mA and/or kV according to patient size and/or use of iterative reconstruction technique. COMPARISON:  None. FINDINGS: Brain: No evidence of acute infarction, hemorrhage, hydrocephalus, extra-axial collection or mass lesion/mass effect. Vascular: No hyperdense vessel or unexpected calcification. Skull: No osseous abnormality. Sinuses/Orbits: Mucosal thickening and air-fluid level in the right maxillary sinus. Mild mucosal thickening of the frontoethmoidal recess bilaterally. Visualized mastoid sinuses are clear. Visualized orbits demonstrate no focal abnormality. Other: None IMPRESSION: 1. No acute intracranial abnormality. 2. Right maxillary sinusitis. Electronically Signed   By: Elige KoHetal   Patel M.D.   On: 03/13/2022 10:20   ? ?Procedures ?Procedures  ? ? ?Medications Ordered in ED ?Medications - No data to display ? ?ED Course/ Medical Decision Making/ A&P ?  ?                        ?Medical Decision Making ?Patient here with new onset weakness and paresthesia of the left upper and lower extremity.  Differential diagnosis includes stroke, space containing lesion, myelopathy.  Case discussed with Dr. Derry LoryKhaliqdina who recommends MRI brain and neck.  Will admit for stroke rule out.  Discussed with internal medicine teaching service who will admit the patient. ? ?Problems Addressed: ?Acute left-sided weakness: acute illness or injury ?Paresthesia: acute illness or injury ? ?Amount and/or Complexity of Data Reviewed ?  Labs: ordered. ?   Details: i reviewed labs- no sig abnormalities. Mild icterus- hepatic fx added and pending. ?Radiology: ordered and independent interpretation performed. ?   Details: I perosnally visualized the patient's CT head. no acute findings ? ?Risk ?Prescription drug management. ?Decision regarding hospitalization. ? ? ? ? ? ?  ? ? ? ? ?Final Clinical Impression(s) / ED Diagnoses ?Final diagnoses:  ?None  ? ? ?Rx / DC Orders ?ED Discharge Orders   ? ? None  ? ?  ? ? ?  ?Arthor Captain, PA-C ?03/14/22 1129 ? ?  ?Margarita Grizzle, MD ?03/16/22 1352 ? ?

## 2022-03-13 NOTE — Progress Notes (Signed)
Nursing team paged on-call IM TS team.  Stated that patient noted his symptoms were mostly resolved except for a mild headache and that he wished to leave and follow-up in the outpatient setting.  At bedside, discussed with patient that neurology had recommended neurosurgery evaluate him prior to discharge because of imaging findings which could be contributing to his presenting symptoms especially in the setting of a negative stroke work-up.  Patient stated that he understood this but would still like to go home. Reiterated to patient that the IMTS attending (head) physician would also need to evaluate him before he could be discharged. He again stated that he wished to leave in a calm and respectful manner. Relayed to patient that he would have to leave AMA or against medical advice. Explained that this means we feel he should remain hospitalized for further evaluation prior to leaving the hospital and the consequences of not doing so. He again calmly reported understanding and thanked me for my time.  ? ?On evaluation patient was HDS and his neuro exam was notable for 5/5 strength of BUE/BLE.  ? ?Left patient information to follow up with neurology, NSGY, and the [facility] clinic on discharge.  ? ? ?

## 2022-03-13 NOTE — ED Notes (Signed)
Pt to MRI

## 2022-03-13 NOTE — Discharge Instructions (Addendum)
[name] while we would've liked you to wait to be evaluated by neurosurgery we still believe it is important for you to schedule follow up with several people:  ? ?Neurosurgery: [facility] ?([phone] ? ?Neurology: [facility] ?([phone] ? ?[facility]: for primary care needs [facility]  ?[phone] ?

## 2022-03-13 NOTE — ED Notes (Signed)
Pt given sandwich, crackers and drink. Phlebotomy at bedside for blood draw. Pt states he is not claustrophobic, has had MRIs in the past with no issues. ?

## 2022-03-13 NOTE — Care Plan (Incomplete)
Patient presents with headache x 1 mo, episodic dizziness and tinnitus, with more recent development of left sided weakness. Suspect the former symptoms are secondary to Meniere's disease, but will fully exclude any dangerous etiology of headache with CTA/CTV.  Left sided weakness and numbness on exam would localize to the C-spine or brain. Despite significant length dependent neuropathy his reflexes are preserved suggesting upper motor neuron process (would expect diminished reflexes otherwise). Given C-spine findings, will ask neurosurgery to evaluate -- may be appropriate for outpatient follow-up but given acuity of his symptoms, feel like their assessment here is valuable.  ?

## 2022-03-13 NOTE — ED Notes (Signed)
Pt informed RN that he is miserable in the bed and would like to go home. Pt educated on the importance of him staying but he insisted on going home. Admitting MD paged, pt agreed to wait a while longer. ?

## 2022-03-13 NOTE — Hospital Course (Signed)
61 y/o M PMH COPD, polysubstance use ? ? ?3d h/o L arm and leg numbness and tingling.  ?3 month h/o episodic vertigo ?Constatnt occipital hA ? ? ? ?

## 2022-03-14 NOTE — Discharge Summary (Signed)
? ?Name: Alejandro Ferguson ?MRN: [id number] ?DOB: 1961-01-03 61 y.o. ?PCP: Patient, No Pcp Per (Inactive) ? ?Date of Admission: 03/12/2022  4:07 PM ?Date of Discharge: 03/13/22 ?Attending Physician: Dr. Dareen Piano ? ?Patient left AMA on day of admission. ? ?Diagnosis: ?Acute parasthesias of left upper and lower extremity, length dependent neuropathy ?Cervical spinal stenosis, severe ?Vertigo ?Occipital headaches ? ?Discharge Medications: ?Allergies as of 03/13/2022   ? ?   Reactions  ? Penicillins Other (See Comments)  ? Has patient had a PCN reaction causing immediate rash, facial/tongue/throat swelling, SOB or lightheadedness with hypotension: Unknown ?Has patient had a PCN reaction causing severe rash involving mucus membranes or skin necrosis: No ?Has patient had a PCN reaction that required hospitalization: No ?Has patient had a PCN reaction occurring within the last 10 years: No (childhood reaction) ?If all of the above answers are "NO", then may proceed with Cephalosporin use. ?Unknown childhood allergic reaction  ? ?  ? ?  ?Medication List  ?  ? ?ASK your doctor about these medications   ? ?aspirin EC 325 MG tablet ?Take 325 mg by mouth daily as needed (pain). ?  ? ?  ? ? ?Disposition and follow-up:   ?Mr.TREAVOR BLOMQUIST was discharged from [facility] in [place] condition.  At the hospital follow up visit please address: ? ?-Refer to neurosurgery for cervical spinal stenosis ?-Refer to neurology if headaches are ongoing ?-Refer to ENT for dizziness and tinnitus for Meniere's workup ? ?Labs / imaging needed at time of follow-up: n/a ? ?Pending labs/ test needing follow-up: n/a ? ?Follow-up Appointments: left prior to arrangement ? ? ?Hospital Course: ? ?42 yom with COPD, alcohol abuse, and polysubstance abuse  who presented to the ED on 4/13 for 3d history of left arm and leg numbness/tingling. Sx first noted upon awakening. Denies associated weakness. Not related to position of his neck. Symptoms are  unchanged from onset. Denies recent head or neck trauma although does not a prior injury to his neck in 1998 when he fell off a scaffold.  ?  ?He reports approximately 4w history of a constant occipital headache. Denies sensitivity to light or sound. No visual disturbance. Denies neck stiffness. Reports that, prior to the last month, he would have a headache every 2-3 months.  ?  ?He reports a 2-3 month history of episodic vertigo, last 2-3 minutes typically. Not positionally related. They can occur while he is driving, walking, sitting, laying in bed. Denies hearing loss or tinnitus.  ? ?Admission neurologic exam was notable for 4/5 weakness left upper extremity finger flexion, flex/extension at elbow, flexion at the hip, and trendelenburg gait. Sensation was intact. Reflexes were normal ? ?CT head, CTA head, CT venogram head and brain MRI were unrevealing. 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 ? ?Neurology was consulted this admission.  ?In regards to the unilateral paraesthesias, expressed concern for an upper motor neuron process. They were planning on consulting neurosurgery.  ?They considered Meniere's disease in regard to the tinnitus and dizziness and recommended outpatient ENT follow up.  ? ?Unfortunately, pt found the stretcher in the ED uncomfortable and requested to leave. He left AMA on 03/13/22 at  approximately 9pm. Our night covering team was able to relay follow up recommendations to him prior to his departure.  ? ?Discharge Vitals:   ?BP (!) 135/106   Pulse 83   Temp 98.3 ?F (36.8 ?C)   Resp (!) 29   Ht 6'  (1.829 m)   Wt 81.6 kg   SpO2 99%   BMI 24.41 kg/m?  ? ?Pertinent Labs, Studies, and Procedures:  ? ? ?  Latest Ref Rng & Units 03/12/2022  ?  4:15 PM 02/11/2019  ?  4:58 AM 02/10/2019  ? 10:52 PM  ?CBC  ?WBC 4.0 - 10.5 K/uL 12.6   10.4   11.4    ?Hemoglobin 13.0 - 17.0 g/dL 17.9   16.0   16.4    ?Hematocrit 39.0 - 52.0 % 54.4   47.8   47.7    ?Platelets 150 - 400 K/uL 289   201   203    ? ? ?  Latest Ref Rng & Units 03/12/2022  ?  4:15 PM 02/11/2019  ?  4:58 AM 02/10/2019  ? 10:52 PM  ?BMP  ?Glucose 70 - 99 mg/dL 118   103     ?BUN 8 - 23 mg/dL 9   9     ?Creatinine 0.61 - 1.24 mg/dL 0.99   0.83   0.95    ?Sodium 135 - 145 mmol/L 138   137     ?Potassium 3.5 - 5.1 mmol/L 4.3   3.5     ?Chloride 98 - 111 mmol/L 108   101     ?CO2 22 - 32 mmol/L 20   25     ?Calcium 8.9 - 10.3 mg/dL 9.0   8.6     ? ? ?  Latest Ref Rng & Units 03/13/2022  ? 11:23 AM 02/10/2019  ? 10:52 PM 07/15/2017  ?  9:53 PM  ?Hepatic Function  ?Total Protein 6.5 - 8.1 g/dL 6.7   6.4   7.1    ?Albumin 3.5 - 5.0 g/dL 3.8   3.3   4.1    ?AST 15 - 41 U/L 21   38   24    ?ALT 0 - 44 U/L '17   28   22    ' ?Alk Phosphatase 38 - 126 U/L 78   68   76    ?Total Bilirubin 0.3 - 1.2 mg/dL 1.8   0.9   0.8    ?Bilirubin, Direct 0.0 - 0.2 mg/dL 0.2   0.2     ? ?Lipid Panel  ?   ?Component Value Date/Time  ? CHOL 94 03/13/2022 1841  ? TRIG 67 03/13/2022 1841  ? HDL 53 03/13/2022 1841  ? CHOLHDL 1.8 03/13/2022 1841  ? VLDL 13 03/13/2022 1841  ? [place] 28 03/13/2022 1841  ?  ?Troponin 6>>6 ?HIV negative ?B12 240 ? ?CT ANGIO HEAD W OR WO CONTRAST ? ?Result Date: 03/13/2022 ?CLINICAL DATA:  Initial evaluation for acute headache, tinnitus. EXAM: CT ANGIOGRAPHY HEAD CT VENOGRAM HEAD TECHNIQUE: Arterial and Venographic phase images of the brain were obtained following the administration of intravenous contrast. Multiplanar reformats and maximum intensity projections were generated. RADIATION DOSE REDUCTION: This exam was performed according to the departmental dose-optimization  program which includes automated exposure control, adjustment of the mA and/or kV according to patient size and/or use of iterative reconstruction technique. CONTRAST:  13m OMNIPAQUE IOHEXOL 350 MG/ML SOLN COMPARISON:  Prior noncontrast head CT from earlier the same day. FINDINGS: CTA HEAD FINDINGS Anterior  circulation: Both internal carotid arteries widely patent to the termini without stenosis. A1 segments widely patent. Normal anterior communicating artery complex. Both anterior cerebral arteries widely patent to their distal aspects without stenosis. No M1 stenosis or occlusion. Normal MCA bifurcations. Distal MCA branches well perfused and symmetric. Posterior circulation: Both V4 segments patent to the vertebrobasilar junction without stenosis. Both PICA origins patent and normal. Basilar widely patent to its distal aspect without stenosis. Superior cerebellar arteries patent bilaterally. Both PCAs primarily supplied via the basilar and are well perfused to there distal aspects. Anatomic variants: None significant.  No intracranial aneurysm. CT VENOGRAM FINDINGS Normal enhancement seen throughout the superior sagittal sinus to the torcula. Transverse and sigmoid sinuses are patent as are the visualized proximal internal jugular veins. Straight sinus, vein of Galen, internal cerebral veins, and basal veins of Rosenthal are patent. No appreciable abnormality about the cavernous sinus. Superior orbital veins symmetric. No appreciable cortical vein thrombosis. No other pathologic enhancement. IMPRESSION: 1. Normal CTA of the head. No large vessel occlusion or other margin finding. No intracranial aneurysm. 2. Normal CT venogram of the head. No evidence for dural sinus thrombosis. Electronically Signed   By: Jeannine Boga M.D.   On: 03/13/2022 19:12  ? ?DG Chest 2 View ? ?Result Date: 03/12/2022 ?CLINICAL DATA:  Chest pain.  Left arm numbness. EXAM: CHEST - 2 VIEW COMPARISON:  Radiograph 02/10/2019, CT  05/13/2016 FINDINGS: Emphysema with apical blebs and chronic bronchial thickening. Chronic hyperinflation. The heart is normal in size. Stable mediastinal contours. No acute airspace disease. No pleural effusion or pne

## 2023-01-22 ENCOUNTER — Emergency Department (HOSPITAL_COMMUNITY)
Admission: EM | Admit: 2023-01-22 | Discharge: 2023-01-22 | Disposition: A | Discharge status: Home / Self Care | Payer: Medicaid Other | Attending: Emergency Medicine | Admitting: Emergency Medicine

## 2023-01-22 ENCOUNTER — Emergency Department (HOSPITAL_COMMUNITY): Payer: Medicaid Other

## 2023-01-22 DIAGNOSIS — M542 Cervicalgia: Secondary | ICD-10-CM | POA: Diagnosis not present

## 2023-01-22 DIAGNOSIS — H9313 Tinnitus, bilateral: Secondary | ICD-10-CM | POA: Diagnosis not present

## 2023-01-22 DIAGNOSIS — R55 Syncope and collapse: Secondary | ICD-10-CM | POA: Insufficient documentation

## 2023-01-22 DIAGNOSIS — Z8673 Personal history of transient ischemic attack (TIA), and cerebral infarction without residual deficits: Secondary | ICD-10-CM | POA: Insufficient documentation

## 2023-01-22 DIAGNOSIS — R519 Headache, unspecified: Secondary | ICD-10-CM | POA: Diagnosis present

## 2023-01-22 DIAGNOSIS — W01198A Fall on same level from slipping, tripping and stumbling with subsequent striking against other object, initial encounter: Secondary | ICD-10-CM | POA: Diagnosis not present

## 2023-01-22 DIAGNOSIS — G8929 Other chronic pain: Secondary | ICD-10-CM

## 2023-01-22 DIAGNOSIS — Z7982 Long term (current) use of aspirin: Secondary | ICD-10-CM | POA: Insufficient documentation

## 2023-01-22 LAB — RAPID URINE DRUG SCREEN, HOSP PERFORMED
Amphetamines: NOT DETECTED
Barbiturates: NOT DETECTED
Benzodiazepines: NOT DETECTED
Cocaine: POSITIVE — AB
Opiates: NOT DETECTED
Tetrahydrocannabinol: POSITIVE — AB

## 2023-01-22 LAB — TROPONIN I (HIGH SENSITIVITY)
Troponin I (High Sensitivity): 6 ng/L (ref <18)
Troponin I (High Sensitivity): 7 ng/L (ref <18)

## 2023-01-22 LAB — BASIC METABOLIC PANEL
Anion gap: 6 (ref 5–15)
BUN: 9 mg/dL (ref 8–23)
CO2: 20 mmol/L — ABNORMAL LOW (ref 22–32)
Calcium: 8.9 mg/dL (ref 8.9–10.3)
Chloride: 102 mmol/L (ref 98–111)
Creatinine, Ser: 0.93 mg/dL (ref 0.61–1.24)
GFR, Estimated: >60 mL/min (ref >60)
Glucose, Bld: 126 mg/dL — ABNORMAL HIGH (ref 70–99)
Potassium: 3.9 mmol/L (ref 3.5–5.1)
Sodium: 128 mmol/L — ABNORMAL LOW (ref 135–145)

## 2023-01-22 LAB — CBC
HCT: 49.9 % (ref 39.0–52.0)
Hemoglobin: 17.2 g/dL — ABNORMAL HIGH (ref 13.0–17.0)
MCH: 33 pg (ref 26.0–34.0)
MCHC: 34.5 g/dL (ref 30.0–36.0)
MCV: 95.8 fL (ref 80.0–100.0)
Platelets: 284 10*3/uL (ref 150–400)
RBC: 5.21 MIL/uL (ref 4.22–5.81)
RDW: 13.2 % (ref 11.5–15.5)
WBC: 9.9 10*3/uL (ref 4.0–10.5)
nRBC: 0 % (ref 0.0–0.2)

## 2023-01-22 LAB — ETHANOL: Alcohol, Ethyl (B): <10 mg/dL (ref <10)

## 2023-01-22 MED ORDER — PROCHLORPERAZINE EDISYLATE 10 MG/2ML IJ SOLN
10.0000 mg | Freq: Once | INTRAMUSCULAR | Status: AC
  Administered 2023-01-22: 10 mg via INTRAVENOUS
  Filled 2023-01-22: qty 2

## 2023-01-22 MED ORDER — SODIUM CHLORIDE 0.9 % IV BOLUS
1000.0000 mL | Freq: Once | INTRAVENOUS | Status: AC
  Administered 2023-01-22: 1000 mL via INTRAVENOUS

## 2023-01-22 MED ORDER — OXYCODONE-ACETAMINOPHEN 5-325 MG PO TABS
1.0000 | ORAL_TABLET | Freq: Once | ORAL | Status: AC
  Administered 2023-01-22: 1 via ORAL
  Filled 2023-01-22: qty 1

## 2023-01-22 MED ORDER — DIPHENHYDRAMINE HCL 50 MG/ML IJ SOLN
25.0000 mg | Freq: Once | INTRAMUSCULAR | Status: AC
  Administered 2023-01-22: 25 mg via INTRAVENOUS
  Filled 2023-01-22: qty 1

## 2023-01-22 MED ORDER — DEXAMETHASONE SODIUM PHOSPHATE 10 MG/ML IJ SOLN
10.0000 mg | Freq: Once | INTRAMUSCULAR | Status: AC
  Administered 2023-01-22: 10 mg via INTRAVENOUS
  Filled 2023-01-22: qty 1

## 2023-01-22 MED ORDER — KETOROLAC TROMETHAMINE 30 MG/ML IJ SOLN
30.0000 mg | Freq: Once | INTRAMUSCULAR | Status: AC
  Administered 2023-01-22: 30 mg via INTRAVENOUS
  Filled 2023-01-22: qty 1

## 2023-01-22 NOTE — ED Provider Triage Note (Signed)
Emergency Medicine Provider Triage Evaluation Note  Alejandro Ferguson , a 62 y.o. male  was evaluated in triage.  Pt complains of dizziness, with near syncope, fall onto right ribs, concern for fracture or other rib contusion, patient denies any chest pain, or worsening headache prior to his near syncopal episode, he has a history of broken neck, reports that he has had some kind of surgery with questionable 3 cadaver bones in his neck from years ago.  He reports that he constantly has a headache around 6-8 out of 10, but has been worse since his fall.  He denies any chest pain, feeling of heart racing.  He denies any previous syncopal episodes or near syncopal episodes.  He reports that he had normal breakfast.  He does endorse recreational cocaine use intermittently, around once a month, last use 3 to 4 days ago.  He reports tobacco abuse, history of alcohol abuse.  He denies any alcohol use today..  Review of Systems  Positive: Dizziness, fall, right rib pain, headache Negative: Vision changes, nausea, vomiting, chest pain, shortness of breath  Physical Exam  BP (!) 155/109 (BP Location: Right Arm)   Pulse 88   Temp 98.6 F (37 C) (Oral)   Resp 18   Ht 6' (1.829 m)   Wt 81.6 kg   SpO2 95%   BMI 24.41 kg/m  Gen:   Awake, no distress   Resp:  Normal effort  MSK:   Moves extremities without difficulty  Other:  Ttp of right ribs, no stepoff, normal breath sounds throughout  Medical Decision Making  Medically screening exam initiated at 3:01 PM.  Appropriate orders placed.  YARETH BAUTISTA was informed that the remainder of the evaluation will be completed by another provider, this initial triage assessment does not replace that evaluation, and the importance of remaining in the ED until their evaluation is complete.  Workup initiated in triage    Anselmo Pickler, [place] 01/22/23 1503

## 2023-01-22 NOTE — ED Notes (Signed)
Pt on phone and notified this staff and registration that pt is stepping outside right quick and will be right back

## 2023-01-22 NOTE — ED Triage Notes (Addendum)
Pt to ED c/o dizziness that occurred this morning, resulting in a fall and hitting right side. Pt denies LOC. Not on blood thinners, alert and oriented in triage. Reports right side pain. Pt also c/o headaches x "months"

## 2023-01-22 NOTE — Discharge Instructions (Addendum)
You were seen in the ER for a near syncopal episode and headache. Thankfully all of your labs and imaging were reassuring without signs of fractures, dislocations, or bleeds. You were given a migraine cocktail which appeared to give you some relief in your headache but not full resolution which means this could be a migraine but also another type of headache given it's chronic nature. If you begin to experience weakness on one side of the body, numbness, difficulty speaking, please return to the ER.

## 2023-01-22 NOTE — ED Provider Notes (Signed)
[place] Provider Note   CSN: [id number] Arrival date & time: 01/22/23  1401     History Chief Complaint  Patient presents with   Near Syncope   Fall   Headache    Alejandro Ferguson is a 62 y.o. male.  Patient with past medical history significant for stroke, tobacco use presents emergency department complaints of near syncope, headache, mechanical fall.  Patient reports that he had a fall earlier this morning when he fell and hit the right side of his chest along a couch.  He reports that when he stood up prior to the fall he felt significant dizziness but denies any loss of consciousness.  Patient not currently on blood thinners.  Patient was reports he was having persistent headaches over the last several months and feels like the medication is are helping to alleviate his pain.  He reports that the Percocet that he was given while in triage did not help to alleviate his headache at all.  Patient denies any prior history significant for migraine headaches.  Patient reports some ringing in his ears that is also chronic in nature but denies any significant nausea, photosensitivity, visual disturbances.   Near Syncope Associated symptoms include headaches.  Fall Associated symptoms include headaches.  Headache Associated symptoms: myalgias, near-syncope and neck pain   Associated symptoms: no fever and no weakness        Home Medications Prior to Admission medications   Medication Sig Start Date End Date Taking? Authorizing Provider  aspirin EC 325 MG tablet Take 325 mg by mouth daily as needed (pain).    [provider]  albuterol-ipratropium (COMBIVENT) 18-103 MCG/ACT inhaler Inhale 2 puffs into the lungs every 6 (six) hours as needed. Shortness of breath   02/16/12  [provider]      Allergies    Penicillins    Review of Systems   Review of Systems  Constitutional:  Negative for fever.  Cardiovascular:   Positive for near-syncope.  Musculoskeletal:  Positive for myalgias and neck pain.  Neurological:  Positive for headaches. Negative for weakness.  All other systems reviewed and are negative.   Physical Exam Updated Vital Signs BP (!) 146/95 (BP Location: Right Arm)   Pulse 77   Temp 97.9 F (36.6 C) (Oral)   Resp 11   Ht 6' (1.829 m)   Wt 81.6 kg   SpO2 97%   BMI 24.41 kg/m  Physical Exam Vitals and nursing note reviewed.  Constitutional:      Appearance: He is well-developed.  HENT:     Head: Normocephalic and atraumatic.  Eyes:     General: No visual field deficit or scleral icterus.    Extraocular Movements: Extraocular movements intact.     Right eye: Normal extraocular motion.     Left eye: Normal extraocular motion.     Pupils: Pupils are equal, round, and reactive to light.  Cardiovascular:     Rate and Rhythm: Normal rate and regular rhythm.  Pulmonary:     Effort: Pulmonary effort is normal.     Breath sounds: Normal breath sounds. No wheezing.  Abdominal:     General: Bowel sounds are normal.     Palpations: Abdomen is soft.  Musculoskeletal:     Cervical back: Normal range of motion and neck supple. No rigidity.  Neurological:     Mental Status: He is alert and oriented to person, place, and time. Mental status is at baseline.  Cranial Nerves: Cranial nerves 2-12 are intact. No cranial nerve deficit or facial asymmetry.     Motor: Motor function is intact. No weakness.     Coordination: Coordination is intact.     ED Results / Procedures / Treatments   Labs (all labs ordered are listed, but only abnormal results are displayed) Labs Reviewed  BASIC METABOLIC PANEL - Abnormal; Notable for the following components:      Result Value   Sodium 128 (*)    CO2 20 (*)    Glucose, Bld 126 (*)    All other components within normal limits  CBC - Abnormal; Notable for the following components:   Hemoglobin 17.2 (*)    All other components within normal  limits  RAPID URINE DRUG SCREEN, HOSP PERFORMED - Abnormal; Notable for the following components:   Cocaine POSITIVE (*)    Tetrahydrocannabinol POSITIVE (*)    All other components within normal limits  ETHANOL  TROPONIN I (HIGH SENSITIVITY)  TROPONIN I (HIGH SENSITIVITY)    EKG None  Radiology CT Head Wo Contrast  Result Date: 01/22/2023 CLINICAL DATA:  Syncope/presyncope, cerebrovascular cause suspected; Neck trauma, dangerous injury mechanism (Age 40-64y) EXAM: CT HEAD WITHOUT CONTRAST CT CERVICAL SPINE WITHOUT CONTRAST TECHNIQUE: Multidetector CT imaging of the head and cervical spine was performed following the standard protocol without intravenous contrast. Multiplanar CT image reconstructions of the cervical spine were also generated. RADIATION DOSE REDUCTION: This exam was performed according to the departmental dose-optimization program which includes automated exposure control, adjustment of the mA and/or kV according to patient size and/or use of iterative reconstruction technique. COMPARISON:  MRI cervical spine March 13, 2022. FINDINGS: CT HEAD FINDINGS Brain: No evidence of acute infarction, hemorrhage, hydrocephalus, extra-axial collection or mass lesion/mass effect. Vascular: No hyperdense vessel identified. Skull: No acute fracture. Sinuses/Orbits: Postop changes involving the right frontal sinus, unchanged. No acute orbital findings. Other: No mastoid effusions. CT CERVICAL SPINE FINDINGS Alignment: Straightening.  No substantial sagittal subluxation. Skull base and vertebrae: No evidence of acute fracture. Vertebral body heights are similar. Soft tissues and spinal canal: No prevertebral fluid or swelling. No visible canal hematoma. Disc levels: Severe multilevel degenerative change. This includes posterior endplate spurring and multilevel facet uncovertebral hypertrophy with resulting varying degrees of neural foraminal stenosis. Upper chest: Emphysema in the lung apices with  blebs. IMPRESSION: No acute intracranial or spine abnormality. Electronically Signed   By: Margaretha Sheffield M.D.   On: 01/22/2023 17:00   CT Cervical Spine Wo Contrast  Result Date: 01/22/2023 CLINICAL DATA:  Syncope/presyncope, cerebrovascular cause suspected; Neck trauma, dangerous injury mechanism (Age 26-64y) EXAM: CT HEAD WITHOUT CONTRAST CT CERVICAL SPINE WITHOUT CONTRAST TECHNIQUE: Multidetector CT imaging of the head and cervical spine was performed following the standard protocol without intravenous contrast. Multiplanar CT image reconstructions of the cervical spine were also generated. RADIATION DOSE REDUCTION: This exam was performed according to the departmental dose-optimization program which includes automated exposure control, adjustment of the mA and/or kV according to patient size and/or use of iterative reconstruction technique. COMPARISON:  MRI cervical spine March 13, 2022. FINDINGS: CT HEAD FINDINGS Brain: No evidence of acute infarction, hemorrhage, hydrocephalus, extra-axial collection or mass lesion/mass effect. Vascular: No hyperdense vessel identified. Skull: No acute fracture. Sinuses/Orbits: Postop changes involving the right frontal sinus, unchanged. No acute orbital findings. Other: No mastoid effusions. CT CERVICAL SPINE FINDINGS Alignment: Straightening.  No substantial sagittal subluxation. Skull base and vertebrae: No evidence of acute fracture. Vertebral body heights are similar. Soft  tissues and spinal canal: No prevertebral fluid or swelling. No visible canal hematoma. Disc levels: Severe multilevel degenerative change. This includes posterior endplate spurring and multilevel facet uncovertebral hypertrophy with resulting varying degrees of neural foraminal stenosis. Upper chest: Emphysema in the lung apices with blebs. IMPRESSION: No acute intracranial or spine abnormality. Electronically Signed   By: Margaretha Sheffield M.D.   On: 01/22/2023 17:00   DG Chest 2  View  Result Date: 01/22/2023 CLINICAL DATA:  Chest pain. EXAM: CHEST - 2 VIEW COMPARISON:  Chest radiographs 03/12/2022 and 02/10/2019; CT chest 06/22/2015 FINDINGS: Cardiac silhouette and mediastinal contours are within limits. Mild calcification within the aortic arch. Unchanged mild chronic interstitial thickening. There again left-greater-than-right apical blebs, better seen on prior CT. Mild hyperinflation. No acute airspace opacity. No pleural effusion pneumothorax. Mild multilevel degenerative disc changes of the thoracic spine. IMPRESSION: Chronic emphysematous changes.  No acute lung process. Electronically Signed   By: Yvonne Kendall M.D.   On: 01/22/2023 15:58    Procedures Procedures   Medications Ordered in ED Medications  oxyCODONE-acetaminophen (PERCOCET/ROXICET) 5-325 MG per tablet 1 tablet (1 tablet Oral Given 01/22/23 1758)  prochlorperazine (COMPAZINE) injection 10 mg (10 mg Intravenous Given 01/22/23 1857)  diphenhydrAMINE (BENADRYL) injection 25 mg (25 mg Intravenous Given 01/22/23 1858)  ketorolac (TORADOL) 30 MG/ML injection 30 mg (30 mg Intravenous Given 01/22/23 1856)  sodium chloride 0.9 % bolus 1,000 mL (0 mLs Intravenous Stopped 01/22/23 2017)  dexamethasone (DECADRON) injection 10 mg (10 mg Intravenous Given 01/22/23 2002)    ED Course/ Medical Decision Making/ A&P                           Medical Decision Making Risk Prescription drug management.   This patient presents to the ED for concern of near syncope, fall, headache.  Differential diagnosis includes ACS, pneumothorax, pneumonia, viral URI, dehydration   Lab Tests:  I Ordered, and personally interpreted labs.  The pertinent results include: Normal CBC, CMP positive for hyponatremia 128, negative ethanol, negative troponin   Imaging Studies ordered:  I ordered imaging studies including CT head, CT cervical spine, chest x-ray I independently visualized and interpreted imaging which showed no acute  intracranial abnormalities or abnormalities noted in cervical spine, negative chest x-ray I agree with the radiologist interpretation   Medicines ordered and prescription drug management:  I ordered medication including Percocet, Compazine, Toradol, Benadryl, fluids for pain, headache Reevaluation of the patient after these medicines showed that the patient improved I have reviewed the patients home medicines and have made adjustments as needed   Problem List / ED Course:  Patient presented emergency department complaints of near syncope, fall, headache.  Patient's nursing well.  Was concern for possible cardiac cause given no prior history of significant near syncopal episodes.  Patient had any similar cardiac history.  Patient denies any history of vertigo as well.  Given her presentation, extensive workup to rule out cardiac pathology was performed which resulted in negative troponins and no acute EKG findings.  Patient's other labs are otherwise unremarkable as well.  Attempted to manage patient's chronic headache with migraine cocktail which patient reports she was able to obtain approximately 30% improvement prior to treatment.  Still unclear picture of this headache is migraine associated with it is associated from other causes such as drug-induced migraine, cluster headaches, cervical radiculopathy, versus other causes.  Given patient's only partial response to migraine cocktail, advised patient not follow-up with neurology  or probably recommended for further evaluation of headaches as well as potential follow-up with neurosurgery given history of foraminal stenosis and cervical spine as well as chronic arthritis and other degenerative changes.  Patient agreeable treatment plan verbalized understanding all return precautions.  All questions answered prior to patient discharge.   Social Determinants of Health:  Tobacco user No primary care provider  Final Clinical Impression(s) / ED  Diagnoses Final diagnoses:  Near syncope  Chronic nonintractable headache, unspecified headache type    Rx / DC Orders ED Discharge Orders     None         Vladimir Creeks 01/22/23 2024    Valarie Merino, MD 01/22/23 [phone]

## 2024-07-06 ENCOUNTER — Other Ambulatory Visit: Payer: Self-pay

## 2024-07-06 ENCOUNTER — Encounter (HOSPITAL_COMMUNITY): Payer: Self-pay

## 2024-07-06 ENCOUNTER — Emergency Department (HOSPITAL_COMMUNITY)
Admission: EM | Admit: 2024-07-06 | Discharge: 2024-07-06 | Disposition: A | Discharge status: Home / Self Care | Attending: Emergency Medicine | Admitting: Emergency Medicine

## 2024-07-06 ENCOUNTER — Emergency Department (HOSPITAL_COMMUNITY)

## 2024-07-06 DIAGNOSIS — R072 Precordial pain: Secondary | ICD-10-CM | POA: Diagnosis present

## 2024-07-06 DIAGNOSIS — R519 Headache, unspecified: Secondary | ICD-10-CM | POA: Diagnosis not present

## 2024-07-06 DIAGNOSIS — J449 Chronic obstructive pulmonary disease, unspecified: Secondary | ICD-10-CM | POA: Diagnosis not present

## 2024-07-06 DIAGNOSIS — R079 Chest pain, unspecified: Secondary | ICD-10-CM

## 2024-07-06 LAB — CBC WITH DIFFERENTIAL/PLATELET
Abs Immature Granulocytes: 0.08 K/uL — ABNORMAL HIGH (ref 0.00–0.07)
Basophils Absolute: 0.1 K/uL (ref 0.0–0.1)
Basophils Relative: 1 %
Eosinophils Absolute: 0.2 K/uL (ref 0.0–0.5)
Eosinophils Relative: 3 %
HCT: 53.7 % — ABNORMAL HIGH (ref 39.0–52.0)
Hemoglobin: 18 g/dL — ABNORMAL HIGH (ref 13.0–17.0)
Immature Granulocytes: 1 %
Lymphocytes Relative: 27 %
Lymphs Abs: 2.3 K/uL (ref 0.7–4.0)
MCH: 32.1 pg (ref 26.0–34.0)
MCHC: 33.5 g/dL (ref 30.0–36.0)
MCV: 95.9 fL (ref 80.0–100.0)
Monocytes Absolute: 0.7 K/uL (ref 0.1–1.0)
Monocytes Relative: 8 %
Neutro Abs: 5.1 K/uL (ref 1.7–7.7)
Neutrophils Relative %: 60 %
Platelets: 267 K/uL (ref 150–400)
RBC: 5.6 MIL/uL (ref 4.22–5.81)
RDW: 14.3 % (ref 11.5–15.5)
WBC: 8.5 K/uL (ref 4.0–10.5)
nRBC: 0 % (ref 0.0–0.2)

## 2024-07-06 LAB — COMPREHENSIVE METABOLIC PANEL WITH GFR
ALT: 23 U/L (ref 0–44)
AST: 25 U/L (ref 15–41)
Albumin: 3.7 g/dL (ref 3.5–5.0)
Alkaline Phosphatase: 79 U/L (ref 38–126)
Anion gap: 9 (ref 5–15)
BUN: 13 mg/dL (ref 8–23)
CO2: 23 mmol/L (ref 22–32)
Calcium: 9.4 mg/dL (ref 8.9–10.3)
Chloride: 105 mmol/L (ref 98–111)
Creatinine, Ser: 0.77 mg/dL (ref 0.61–1.24)
GFR, Estimated: >60 mL/min (ref >60)
Glucose, Bld: 108 mg/dL — ABNORMAL HIGH (ref 70–99)
Potassium: 4.1 mmol/L (ref 3.5–5.1)
Sodium: 137 mmol/L (ref 135–145)
Total Bilirubin: 1.3 mg/dL — ABNORMAL HIGH (ref 0.0–1.2)
Total Protein: 6.8 g/dL (ref 6.5–8.1)

## 2024-07-06 LAB — TROPONIN I (HIGH SENSITIVITY)
Troponin I (High Sensitivity): 6 ng/L (ref <18)
Troponin I (High Sensitivity): 7 ng/L (ref <18)

## 2024-07-06 LAB — D-DIMER, QUANTITATIVE: D-Dimer, Quant: 0.29 ug{FEU}/mL (ref 0.00–0.50)

## 2024-07-06 MED ORDER — ACETAMINOPHEN 500 MG PO TABS: 1000.0000 mg | ORAL_TABLET | Freq: Once | ORAL | Status: DC

## 2024-07-06 MED ORDER — LACTATED RINGERS IV BOLUS
1000.0000 mL | Freq: Once | INTRAVENOUS | Status: AC
  Administered 2024-07-06: 1000 mL via INTRAVENOUS

## 2024-07-06 MED ORDER — METOCLOPRAMIDE HCL 5 MG/ML IJ SOLN
10.0000 mg | Freq: Once | INTRAMUSCULAR | Status: AC
  Administered 2024-07-06: 10 mg via INTRAVENOUS
  Filled 2024-07-06: qty 2

## 2024-07-06 MED ORDER — KETOROLAC TROMETHAMINE 15 MG/ML IJ SOLN
15.0000 mg | Freq: Once | INTRAMUSCULAR | Status: AC
  Administered 2024-07-06: 15 mg via INTRAVENOUS
  Filled 2024-07-06: qty 1

## 2024-07-06 MED ORDER — METOCLOPRAMIDE HCL 5 MG/ML IJ SOLN
20.0000 mg | Freq: Once | INTRAVENOUS | Status: DC
  Filled 2024-07-06: qty 4

## 2024-07-06 NOTE — ED Notes (Signed)
 Patient ambulated to restroom.

## 2024-07-06 NOTE — ED Provider Notes (Signed)
 Patient received during handoff from prior team.  63 year old male past medical history hypertension presenting to the emergency department for crushing substernal chest pain.  EKG nonischemic, labs pending.  Plan at handoff: -Follow-up laboratory studies including troponin -If troponin within normal limits and patient remains asymptomatic anticipate patient can be discharged with outpatient cardiology follow-up -Referral to cardiology at discharge   Physical Exam  BP (!) 143/67   Pulse 76   Temp 98.2 F (36.8 C) (Oral)   Resp 16   Ht 6' (1.829 m)   Wt 81.6 kg   SpO2 100%   BMI 24.41 kg/m   Physical Exam Vitals reviewed.  Constitutional:      General: He is not in acute distress.    Appearance: He is not ill-appearing.  Cardiovascular:     Rate and Rhythm: Normal rate and regular rhythm.     Heart sounds: Normal heart sounds.  Pulmonary:     Effort: Pulmonary effort is normal. No tachypnea.  Musculoskeletal:     Right lower leg: No edema.     Left lower leg: No edema.     Comments: Spontaneous movement of bilateral upper and lower extremities  Neurological:     Mental Status: He is alert.  Psychiatric:        Behavior: Behavior is cooperative.     Procedures  Procedures  ED Course / MDM   Clinical Course as of 07/06/24 1742  Thu Jul 06, 2024  1254 EKG 12-Lead Rate 100, sinus tachycardia with normal axis and intervals.  Deep S waves in lateral leads.  No STE or depression.  No TWI. Mild lvh [RC]  1503 Hemoglobin(!): 18.0 [RC]  1503 D-Dimer, Quant: 0.29 Defer CT at this time.  Did speak with patient about his right-sided pulmonary nodule that will require outpatient CT [RC]  1509 S. PMH HTN. CC crushing substernal chest pain. EKG non ischemic. Labs [  ] cards referral at DC [AG]  1531 Troponin I (High Sensitivity): 6 [RC]    Clinical Course User Index [AG] Nada Chroman, DO [RC] Sharyne Darina RAMAN, MD   Medical Decision Making Amount and/or Complexity of  Data Reviewed Labs: ordered. Decision-making details documented in ED Course. Radiology: ordered. ECG/medicine tests:  Decision-making details documented in ED Course.  Risk OTC drugs. Prescription drug management.   On initial evaluation of the patient he is hemodynamically stable, afebrile and not in acute distress.  Patient currently denies any recurrent episodes of chest pain and has no complaints at this time.  Laboratory studies reviewed without evidence of infection, anemia, electrolyte abnormality, renal impairment, thrombocytopenia.  D-dimer within normal limits.  Delta troponin within normal limits.  At this time believe patient is safe to be discharged home with strict return precautions.  Cardiology ambulatory referral given at discharge.  Patient agrees with and understands plan.  Chroman Nada DO  Emergency Medicine PGY2       Nada Chroman, DO 07/06/24 1921    Emil Share, DO 07/06/24 2156

## 2024-07-06 NOTE — ED Triage Notes (Signed)
 Chest pain radiating down left arm started at 930 this morning.

## 2024-07-06 NOTE — ED Triage Notes (Signed)
 Pt here for substernal chest pain that started last night radiates down left arm. C/O HA. Denies dizziness/nausea/vomiting/SHOB.

## 2024-07-06 NOTE — ED Provider Notes (Signed)
 [place] EMERGENCY DEPARTMENT AT [facility] Provider Note  MDM   HPI/ROS:  Alejandro Ferguson is a 63 y.o. male with a medical history as below who presents with acute onset nonexertional crushing substernal chest pain radiating to his left arm that happened twice today.  The first time just resting on the couch with a friend.  Second time while driving.  Both instances lasted approximately 10 minutes and spontaneously subsided.  He denies any shortness of breath, pleuritic chest pain, recent illness such as cough, fever, diarrhea.  He also denies any leg swelling.  States that he is pretty active at baseline and has not had chest pain like this before.  He is also endorsing a headache that he states is chronic and been similar for years.  Denies any numbness, tingling, word finding difficulty or imbalance.  Physical exam is notable for: - Well-appearing male, hemodynamically intact.  Benign neurologic exam.  Mild diffuse end expiratory wheeze.  No leg swelling  On my initial evaluation, patient is:  -Vital signs stable. Patient afebrile, hemodynamically stable, and non-toxic appearing. -Additional history obtained from chart review   Differentials include ACS, pneumonia, PE, dissection, pneumothorax, COPD exacerbation, CHF.    Physical exam is reassuring as patient has no rales, lower extremity edema that would be concerning for CHF.  He does have mild end expiratory wheeze but is oxygenating well on room air with no increased work of breathing or prolonged expiration.  His story is moderately concerning for ACS however EKG as below is reassuring.  His laboratory workup is also detailed below.  He is pending the results of delta troponin however hear score is 3.  His ultimate disposition will likely depend on this delta troponin however I believe if there is no significant elevation appropriate for discharge with cardiology follow-up.  He was discharged in stable  condition.  Interpretations, interventions, and the patient's course of care are documented below.    Clinical Course as of 07/06/24 1533  Thu Jul 06, 2024  1254 EKG 12-Lead Rate 100, sinus tachycardia with normal axis and intervals.  Deep S waves in lateral leads.  No STE or depression.  No TWI. Mild lvh [RC]  1503 Hemoglobin(!): 18.0 [RC]  1503 D-Dimer, Quant: 0.29 Defer CT at this time.  Did speak with patient about his right-sided pulmonary nodule that will require outpatient CT [RC]     1531 Troponin I (High Sensitivity): 6 [RC]    Clinical Course User Index  [RC] Sharyne Darina RAMAN, MD      Disposition:  Handoff: Pending delta troponin  Clinical Impression:  1. Precordial pain     The plan for this patient was discussed with Dr. Freddi, who voiced agreement and who oversaw evaluation and treatment of this patient.   Clinical Complexity A medically appropriate history, review of systems, and physical exam was performed.  My independent interpretations of EKG, labs, and radiology are documented in the ED course above.   If decision rules were used in this patient's evaluation, they are listed below.   Click here for ABCD2, HEART and other calculatorsREFRESH Note before signing   Patient's presentation is most consistent with acute presentation with potential threat to life or bodily function.  Medical Decision Making Amount and/or Complexity of Data Reviewed Labs: ordered. Decision-making details documented in ED Course. Radiology: ordered. ECG/medicine tests:  Decision-making details documented in ED Course.  Risk OTC drugs.    HPI/ROS      See MDM section for  pertinent HPI and ROS. A complete ROS was performed with pertinent positives/negatives noted above.   Past Medical History:  Diagnosis Date   Anxiety    COPD (chronic obstructive pulmonary disease) (HCC)    Emphysema    ETOH abuse    Pneumonia    several times (07/09/2017)   Polysubstance  abuse (HCC)    THC, Cocaine   Tuberculosis    when I was little (07/09/2017)    Past Surgical History:  Procedure Laterality Date   ANTERIOR CERVICAL DECOMP/DISCECTOMY FUSION     APPENDECTOMY     BACK SURGERY     INGUINAL HERNIA REPAIR Left    ORBITAL FRACTURE SURGERY Right    I've got a plate over my right eye   SHOULDER ARTHROSCOPY WITH ROTATOR CUFF REPAIR Left    ULNAR NERVE REPAIR Right    @ elbow      Physical Exam   Vitals:   07/06/24 1212 07/06/24 1239  BP: (!) 143/103   Pulse: (!) 105   Resp: 18   Temp: 97.9 F (36.6 C)   SpO2: 96%   Weight:  81.6 kg  Height:  6' (1.829 m)    Physical Exam Vitals and nursing note reviewed.  Constitutional:      General: He is not in acute distress.    Appearance: He is well-developed.  HENT:     Head: Normocephalic and atraumatic.  Eyes:     Conjunctiva/sclera: Conjunctivae normal.  Cardiovascular:     Rate and Rhythm: Normal rate and regular rhythm.     Heart sounds: No murmur heard. Pulmonary:     Effort: Pulmonary effort is normal. No respiratory distress.     Breath sounds: Normal breath sounds.  Abdominal:     Palpations: Abdomen is soft.     Tenderness: There is no abdominal tenderness.  Musculoskeletal:        General: No swelling.     Cervical back: Neck supple.  Skin:    General: Skin is warm and dry.     Capillary Refill: Capillary refill takes less than 2 seconds.  Neurological:     General: No focal deficit present.     Mental Status: He is alert and oriented to person, place, and time. Mental status is at baseline.     Cranial Nerves: Cranial nerves 2-12 are intact.     Sensory: Sensation is intact.     Motor: Motor function is intact.  Psychiatric:        Mood and Affect: Mood normal.      Procedures   If procedures were preformed on this patient, they are listed below:  Procedures   @BBSIG @   Please note that this documentation was produced with the assistance of voice-to-text  technology and may contain errors.    Sharyne Darina RAMAN, MD 07/06/24 1534    Freddi Hamilton, MD 07/07/24 [phone]

## 2024-07-06 NOTE — Discharge Instructions (Signed)
 Thank you for allowing us  to care for you today.  You came to emergency department because of chest pain.  At this time we believe you are safe to be discharged home as your EKG did not show evidence of acute ischemia and your heart enzymes are within normal limits.  We have given you a referral to a cardiologist.  Please call to schedule an appointment.  Please return to the emergency department if you experience worsening or changing chest pain, shortness of breath, passout or believe you need emergent medical care.  We hope you feel better soon

## 2024-07-11 ENCOUNTER — Encounter: Payer: Self-pay | Admitting: Physician Assistant

## 2024-07-11 ENCOUNTER — Ambulatory Visit: Attending: Physician Assistant | Admitting: Physician Assistant

## 2024-07-11 VITALS — BP 130/94 | HR 108 | Ht 72.0 in | Wt 191.2 lb

## 2024-07-11 DIAGNOSIS — R911 Solitary pulmonary nodule: Secondary | ICD-10-CM | POA: Insufficient documentation

## 2024-07-11 DIAGNOSIS — R072 Precordial pain: Secondary | ICD-10-CM | POA: Insufficient documentation

## 2024-07-11 DIAGNOSIS — Z0000 Encounter for general adult medical examination without abnormal findings: Secondary | ICD-10-CM | POA: Insufficient documentation

## 2024-07-11 DIAGNOSIS — F1010 Alcohol abuse, uncomplicated: Secondary | ICD-10-CM | POA: Insufficient documentation

## 2024-07-11 DIAGNOSIS — I10 Essential (primary) hypertension: Secondary | ICD-10-CM | POA: Insufficient documentation

## 2024-07-11 DIAGNOSIS — F1410 Cocaine abuse, uncomplicated: Secondary | ICD-10-CM | POA: Diagnosis present

## 2024-07-11 DIAGNOSIS — Z720 Tobacco use: Secondary | ICD-10-CM | POA: Insufficient documentation

## 2024-07-11 MED ORDER — VALSARTAN 80 MG PO TABS: 80.0000 mg | ORAL_TABLET | Freq: Every day | ORAL | 3 refills | Status: AC

## 2024-07-11 NOTE — Progress Notes (Signed)
 OFFICE NOTE:    Date:  07/11/2024  ID:  Alejandro Ferguson, DOB [birth year], MRN [id number] PCP: Patient, No Pcp Per  Alejandro Ferguson Providers Cardiologist:  None        Pharmacologic MPI 05/18/2018 [facility]): No ischemia or scar, EF 65 Chronic Obstructive Pulmonary Disease Substance abuse with marijuana, cocaine Tobacco use Anxiety Cervical spinal stenosis (CT 02/2022)       Discussed the use of AI scribe software for clinical note transcription with the patient, who gave verbal consent to proceed. History of Present Illness Alejandro Ferguson is a 63 y.o. male who is referred by Alejandro Share, DO for chest pain. He was evaluated in the ED on 07/06/24.   EKG performed 07/06/2024 personally reviewed and interpreted by me today 07/10/2024: Sinus tachycardia, HR 104, normal axis, no ST-T wave changes, QTc 433.  Labs performed in the emergency room 07/06/2024 personally reviewed and interpreted by me today 07/10/2024: CXR with emphysematous changes, 6 mm right mid lung nodule; potassium normal (4.1), creatinine normal (0.77), ALT normal (23), D-dimer normal (0.29), hemoglobin elevated (18), high-sensitivity troponin negative x 2 (6 >> 7).  He experienced chest pain on July 06, 2024, described as a pressure in the middle of his chest lasting about 30 minutes, with slight pain in his left arm. The pain occurred while at rest and was not associated with radiation to the jaw, shortness of breath, sweating, nausea, or syncope.  He has not had exertional chest pain or shortness of breath.  He has not had any recurrent symptoms since he went to the emergency room.  He has a history of high blood pressure and was previously prescribed amlodipine, which he discontinued due to experiencing 'bad dreams and bad thoughts.' He is not currently on any blood pressure medication but takes aspirin  regularly.   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 He consumes alcohol,  approximately three to four beers or two to three vodka drinks a day, and has used cocaine as recently as a week ago. He has tried legal marijuana but does not use it daily.   He is retired but occasionally works on houses, which he finds physically demanding but manageable.    Review of Systems  Constitutional: Negative for chills and fever.  Respiratory:  Negative for cough.   Gastrointestinal:  Negative for diarrhea, hematochezia, melena and vomiting.  Genitourinary:  Negative for hematuria.  -See HPI    Studies Reviewed:       HYPERTENSION CONTROL Vitals:   07/11/24 1516 07/11/24 1623  BP: (!) 150/80 (!) 130/94    The patient's blood pressure is elevated above target today.  In order to address the patient's elevated BP: A new medication was prescribed today.         Physical Exam:  VS:  BP (!) 130/94   Pulse (!) 108   Ht 6' (1.829 m)   Wt 191 lb 3.2 oz (86.7 kg)   SpO2 98%   BMI 25.93 kg/m        Wt Readings from Last 3 Encounters:  07/11/24 191 lb 3.2 oz (86.7 kg)  07/06/24 180 lb (81.6 kg)  01/22/23 180 lb (81.6 kg)    Constitutional:      Appearance: Healthy appearance. Not in distress.  Eyes:     Comments: ?Xanthelasma L eyelid  Neck:     Vascular: No carotid bruit. JVD normal.  Pulmonary:  Breath sounds: Normal breath sounds. No wheezing. No rales.  Cardiovascular:     Normal rate. Regular rhythm.     Murmurs: There is no murmur.     Comments: DP/PT 2+ bilaterally Edema:    Peripheral edema absent.  Abdominal:     Palpations: Abdomen is soft.       Assessment and Plan:    Assessment & Plan Precordial chest pain Recent evaluation emergency room for chest pain lasting 30 minutes.  Troponins were negative, which is reassuring.  He does have risk factors for coronary artery disease.  He has a high baseline heart rate.  I am concerned about trying to give him metoprolol for coronary CTA given his history of cocaine use.  Given his high baseline  heart rate, I am not sure we would get him to target regardless.  Therefore, recommended proceeding with stress Myoview study. - Order exercise myocardial perfusion imaging study to nonischemic heart disease - Order echocardiogram to rule out structural heart disease - Continue aspirin  81 mg daily - Arrange fasting lipid panel Lung nodule Noted on recent chest x-ray.  He has follow-up with primary care tomorrow to arrange follow-up testing. Essential hypertension Blood pressure uncontrolled.  He was given amlodipine by primary care and had side effects to this.  I am concerned about trying to give him diltiazem as he may experience the same side effects.  I am also concerned about giving a beta-blocker given his history of cocaine use.  We could consider nonselective beta-blocker.  However with his smoking history and reported history of COPD, he may not be able to tolerate this.  Therefore recommend starting on ARB. - Start valsartan  80 mg daily - BMET 2 to 3 weeks Tobacco use We discussed the important of quitting. I have encouraged him to work on this.  Alcohol abuse I have recommended he reduce his intake to 1-2 drinks or less per day. Cocaine abuse (HCC) We discussed the dangers of cocaine use including myocardial infarction and death. I have recommended cessation.       Informed Consent   Shared Decision Making/Informed Consent The risks [chest pain, shortness of breath, cardiac arrhythmias, dizziness, blood pressure fluctuations, myocardial infarction, stroke/transient ischemic attack, nausea, vomiting, allergic reaction, radiation exposure, metallic taste sensation and life-threatening complications (estimated to be 1 in 10,000)], benefits (risk stratification, diagnosing coronary artery disease, treatment guidance) and alternatives of a nuclear stress test were discussed in detail with Alejandro Ferguson and he agrees to proceed.     Dispo:  Return in 3 months (on 10/11/2024) for Follow up  after testing, w/ Alejandro Ferrier, PA-C.  Signed, Alejandro Ferrier, PA-C

## 2024-07-11 NOTE — Patient Instructions (Signed)
 Medication Instructions:  Your physician has recommended you make the following change in your medication:  START Valsartan  80 mg taking  1daily  *If you need a refill on your cardiac medications before your next appointment, please call your pharmacy*  Lab Work: When you come for the stress test, come FASTING AND GO TO THE LAB FOR:  LIPID & CMET  If you have labs (blood work) drawn today and your tests are completely normal, you will receive your results only by: MyChart Message (if you have MyChart) OR A paper copy in the mail If you have any lab test that is abnormal or we need to change your treatment, we will call you to review the results.  Testing/Procedures: Your physician has requested that you have an echocardiogram. Echocardiography is a painless test that uses sound waves to create images of your heart. It provides your doctor with information about the size and shape of your heart and how well your heart's chambers and valves are working. This procedure takes approximately one hour. There are no restrictions for this procedure. Please do NOT wear cologne, perfume, aftershave, or lotions (deodorant is allowed). Please arrive 15 minutes prior to your appointment time.  Please note: We ask at that you not bring children with you during ultrasound (echo/ vascular) testing. Due to room size and safety concerns, children are not allowed in the ultrasound rooms during exams. Our front office staff cannot provide observation of children in our lobby area while testing is being conducted. An adult accompanying a patient to their appointment will only be allowed in the ultrasound room at the discretion of the ultrasound technician under special circumstances. We apologize for any inconvenience.   Your physician has requested that you have en exercise stress myoview. For further information please visit [email or web address] Please follow instruction sheet, BELOW:     You are scheduled for  a Myocardial Perfusion Study. Please arrive 15 minutes prior to your appointment time for registration and insurance purposes.  The test will take approximately 3-4 hours to complete; you may bring reading material.  If someone comes with you to your appointment, they will need to remain in the main lobby due to limited space in the testing area.  If you are pregnant or breastfeeding, please notify the nuclear lab prior to your appointment.  HOW TO PREPARE FOR YOUR TEST:  DO NOT eat or drink 3 hours prior to your test, except you may have water DO NOT consume products containing caffeine (regular or decaffeinated) 12 hours prior to your test (ex: coffee, chocolate, sodas, tea.) DO bring a list of your current medications with you.  If not listed below, you may take your medications as normal. DO wear comfortable clothes (no dresses or overalls) and walking shoes, tennis shoes preferred (No heels or open toe shoes are allowed) DO NOT wear cologne, perfume, aftershave, or lotions (deodorant is allowed). If these instructions are not followed, your test will have to be rescheduled.   Please report to [address] [place], [place] [address].  If you have questions or concerns about your appointment, you can call the Nuclear Lab [phone].  If you cannot keep your appointment, please provide 24 hours notification to the Nuclear Lab, to avoid a possible $50 charge to your account.   Follow-Up: At [facility], you and your health needs are our priority.  As part of our continuing mission to provide you with exceptional heart care, our providers are all part of one team.  This team includes your primary Cardiologist (physician) and Advanced Practice Providers or APPs (Physician Assistants and Nurse Practitioners) who all work together to provide you with the care you need, when you need it.  Your next appointment:   2-3 MONTHS  month(s)  Provider:   Glendia Ferrier, PA-C          We  recommend signing up for the patient portal called MyChart.  Sign up information is provided on this After Visit Summary.  MyChart is used to connect with patients for Virtual Visits (Telemedicine).  Patients are able to view lab/test results, encounter notes, upcoming appointments, etc.  Non-urgent messages can be sent to your provider as well.   To learn more about what you can do with MyChart, go to [email or web address].   Other Instructions

## 2024-07-12 ENCOUNTER — Encounter (HOSPITAL_COMMUNITY): Payer: Self-pay | Admitting: *Deleted

## 2024-07-12 ENCOUNTER — Telehealth (HOSPITAL_COMMUNITY): Payer: Self-pay | Admitting: *Deleted

## 2024-07-12 NOTE — Telephone Encounter (Signed)
 MPI instructions sent via [employer].

## 2024-07-18 ENCOUNTER — Other Ambulatory Visit: Payer: Self-pay | Admitting: Physician Assistant

## 2024-07-18 DIAGNOSIS — R072 Precordial pain: Secondary | ICD-10-CM

## 2024-07-20 ENCOUNTER — Inpatient Hospital Stay (HOSPITAL_COMMUNITY)
Admission: RE | Admit: 2024-07-20 | Source: Ambulatory Visit | Attending: Physician Assistant | Admitting: Physician Assistant

## 2024-07-24 ENCOUNTER — Encounter (HOSPITAL_COMMUNITY): Payer: Self-pay | Admitting: Physician Assistant

## 2024-08-16 ENCOUNTER — Ambulatory Visit (HOSPITAL_COMMUNITY)
Admission: RE | Admit: 2024-08-16 | Source: Ambulatory Visit | Attending: Physician Assistant | Admitting: Physician Assistant

## 2024-08-21 ENCOUNTER — Encounter (HOSPITAL_COMMUNITY): Payer: Self-pay | Admitting: Physician Assistant

## 2024-10-10 NOTE — Progress Notes (Deleted)
    OFFICE NOTE:    Date:  10/10/2024  ID:  Alejandro Ferguson, DOB 09/17/1961, MRN [id number] PCP: Patient, No Pcp Per  [facility] Providers Cardiologist:  None { Click to update primary MD,subspecialty MD or APP then REFRESH:1}      *** Pharmacologic MPI 05/18/2018 [facility]): No ischemia or scar, EF 65 Chronic Obstructive Pulmonary Disease Substance abuse with marijuana, cocaine Tobacco use Anxiety Cervical spinal stenosis (CT 02/2022)        Discussed the use of AI scribe software for clinical note transcription with the patient, who gave verbal consent to proceed. History of Present Illness Alejandro Ferguson is a 63 y.o. male Patient was evaluated for chest pain in August 2025.  He has a history of cocaine abuse.  Since metoprolol is given prior to CCTA, we opted for nuclear stress test instead.  He did not show for his nuclear stress test or echocardiogram.    ROS-See HPI***    Studies Reviewed:      *** Results  Risk Assessment/Calculations: {Does this patient have ATRIAL FIBRILLATION?:[phone]} No BP recorded.  {Refresh Note OR Click here to enter BP  :1}***      Physical Exam:  VS:  There were no vitals taken for this visit.       Wt Readings from Last 3 Encounters:  07/11/24 191 lb 3.2 oz (86.7 kg)  07/06/24 180 lb (81.6 kg)  01/22/23 180 lb (81.6 kg)    Physical Exam***     Assessment and Plan:    Assessment & Plan Precordial chest pain  Lung nodule  Essential hypertension  Tobacco use  Alcohol abuse  Cocaine abuse (HCC)  Assessment and Plan Assessment & Plan    {      :1}    {Are you ordering a CV Procedure (e.g. stress test, cath, DCCV, TEE, etc)?   Press F2        :[id number]}  Dispo:  No follow-ups on file.  Signed, Glendia Ferrier, PA-C

## 2024-10-11 ENCOUNTER — Ambulatory Visit: Attending: Student in an Organized Health Care Education/Training Program | Admitting: Physician Assistant

## 2024-10-11 DIAGNOSIS — R072 Precordial pain: Secondary | ICD-10-CM

## 2024-10-11 DIAGNOSIS — R911 Solitary pulmonary nodule: Secondary | ICD-10-CM

## 2024-10-11 DIAGNOSIS — F1410 Cocaine abuse, uncomplicated: Secondary | ICD-10-CM

## 2024-10-11 DIAGNOSIS — I10 Essential (primary) hypertension: Secondary | ICD-10-CM

## 2024-10-11 DIAGNOSIS — Z720 Tobacco use: Secondary | ICD-10-CM

## 2024-10-11 DIAGNOSIS — F1010 Alcohol abuse, uncomplicated: Secondary | ICD-10-CM

## 2024-12-10 ENCOUNTER — Other Ambulatory Visit: Payer: Self-pay

## 2024-12-10 ENCOUNTER — Emergency Department (HOSPITAL_COMMUNITY)
Admission: EM | Admit: 2024-12-10 | Discharge: 2024-12-10 | Discharge status: Against Medical Advice | Attending: Emergency Medicine | Admitting: Emergency Medicine

## 2024-12-10 ENCOUNTER — Emergency Department (HOSPITAL_COMMUNITY)

## 2024-12-10 DIAGNOSIS — M791 Myalgia, unspecified site: Secondary | ICD-10-CM | POA: Diagnosis not present

## 2024-12-10 DIAGNOSIS — Z5321 Procedure and treatment not carried out due to patient leaving prior to being seen by health care provider: Secondary | ICD-10-CM | POA: Diagnosis not present

## 2024-12-10 DIAGNOSIS — R0602 Shortness of breath: Secondary | ICD-10-CM | POA: Diagnosis not present

## 2024-12-10 DIAGNOSIS — R059 Cough, unspecified: Secondary | ICD-10-CM | POA: Insufficient documentation

## 2024-12-10 DIAGNOSIS — R519 Headache, unspecified: Secondary | ICD-10-CM | POA: Diagnosis present

## 2024-12-10 LAB — BASIC METABOLIC PANEL WITH GFR
Anion gap: 12 (ref 5–15)
BUN: 9 mg/dL (ref 8–23)
CO2: 26 mmol/L (ref 22–32)
Calcium: 9.8 mg/dL (ref 8.9–10.3)
Chloride: 100 mmol/L (ref 98–111)
Creatinine, Ser: 0.86 mg/dL (ref 0.61–1.24)
GFR, Estimated: >60 mL/min
Glucose, Bld: 90 mg/dL (ref 70–99)
Potassium: 4.3 mmol/L (ref 3.5–5.1)
Sodium: 137 mmol/L (ref 135–145)

## 2024-12-10 LAB — RESP PANEL BY RT-PCR (RSV, FLU A&B, COVID)  RVPGX2
Influenza A by PCR: NEGATIVE
Influenza B by PCR: NEGATIVE
Resp Syncytial Virus by PCR: NEGATIVE
SARS Coronavirus 2 by RT PCR: NEGATIVE

## 2024-12-10 LAB — CBC WITH DIFFERENTIAL/PLATELET
Abs Immature Granulocytes: 0.17 K/uL — ABNORMAL HIGH (ref 0.00–0.07)
Basophils Absolute: 0.1 K/uL (ref 0.0–0.1)
Basophils Relative: 1 %
Eosinophils Absolute: 0.3 K/uL (ref 0.0–0.5)
Eosinophils Relative: 3 %
HCT: 49.5 % (ref 39.0–52.0)
Hemoglobin: 16.4 g/dL (ref 13.0–17.0)
Immature Granulocytes: 2 %
Lymphocytes Relative: 29 %
Lymphs Abs: 3.2 K/uL (ref 0.7–4.0)
MCH: 31.6 pg (ref 26.0–34.0)
MCHC: 33.1 g/dL (ref 30.0–36.0)
MCV: 95.4 fL (ref 80.0–100.0)
Monocytes Absolute: 0.8 K/uL (ref 0.1–1.0)
Monocytes Relative: 7 %
Neutro Abs: 6.6 K/uL (ref 1.7–7.7)
Neutrophils Relative %: 58 %
Platelets: 334 K/uL (ref 150–400)
RBC: 5.19 MIL/uL (ref 4.22–5.81)
RDW: 12.5 % (ref 11.5–15.5)
WBC: 11.1 K/uL — ABNORMAL HIGH (ref 4.0–10.5)
nRBC: 0 % (ref 0.0–0.2)

## 2024-12-10 MED ORDER — ACETAMINOPHEN 325 MG PO TABS
975.0000 mg | ORAL_TABLET | Freq: Once | ORAL | Status: AC
  Administered 2024-12-10: 975 mg via ORAL
  Filled 2024-12-10: qty 3

## 2024-12-10 MED ORDER — DM-GUAIFENESIN ER 30-600 MG PO TB12
1.0000 | ORAL_TABLET | Freq: Two times a day (BID) | ORAL | Status: DC
  Administered 2024-12-10: 1 via ORAL
  Filled 2024-12-10: qty 1

## 2024-12-10 MED ORDER — DM-GUAIFENESIN ER 30-600 MG PO TB12: 1.0000 | ORAL_TABLET | Freq: Two times a day (BID) | ORAL | Status: DC

## 2024-12-10 NOTE — ED Provider Triage Note (Signed)
 Emergency Medicine Provider Triage Evaluation Note  NAREG BREIGHNER , a 64 y.o. male  was evaluated in triage.  Pt complains of HA, body aches, cough, mild shob for past 10 days. No known sick contacts. Been using cold and fluid, tylenol . NVD. Denies cp  Review of Systems  Positive: See hpi Negative:   Physical Exam  BP (!) 135/99 (BP Location: Left Arm)   Pulse 89   Temp 98.3 F (36.8 C) (Oral)   Resp 16   SpO2 96%  Gen:   Awake, no distress   Resp:  Normal effort  MSK:   Moves extremities without difficulty  Other:  No pedal edema  Medical Decision Making  Medically screening exam initiated at 4:21 PM.  Appropriate orders placed.  YALE GOLLA was informed that the remainder of the evaluation will be completed by another provider, this initial triage assessment does not replace that evaluation, and the importance of remaining in the ED until their evaluation is complete.  Resp panel and CXR ordered Tylenol  for HA Mucinex  dm for cough, congestion   Minnie Tinnie BRAVO, PA 12/10/24 1625

## 2024-12-10 NOTE — ED Triage Notes (Signed)
 Patient reports fatigue/congestion/cough for 10 days. Patient is alert and oriented x 4. Airway patent, respirations even and unlabored. Skin normal, warm and dry.
